# Patient Record
Sex: Female | Born: 1985 | Race: Asian | Hispanic: No | Marital: Married | Smoking: Never smoker
Health system: Southern US, Community
[De-identification: ages and names within clinical notes are randomized; demographics above are authoritative.]

## PROBLEM LIST (undated history)

## (undated) ENCOUNTER — Inpatient Hospital Stay (HOSPITAL_COMMUNITY): Payer: Self-pay

## (undated) DIAGNOSIS — D649 Anemia, unspecified: Secondary | ICD-10-CM

## (undated) HISTORY — PX: NO PAST SURGERIES: SHX2092

---

## 2006-11-04 ENCOUNTER — Inpatient Hospital Stay (HOSPITAL_COMMUNITY): Admission: EM | Admit: 2006-11-04 | Discharge: 2006-11-06 | Payer: Self-pay | Admitting: Emergency Medicine

## 2008-09-30 ENCOUNTER — Other Ambulatory Visit: Admission: RE | Admit: 2008-09-30 | Discharge: 2008-09-30 | Payer: Self-pay | Admitting: Family Medicine

## 2008-11-08 ENCOUNTER — Emergency Department: Payer: Self-pay | Admitting: Internal Medicine

## 2008-11-08 ENCOUNTER — Inpatient Hospital Stay (HOSPITAL_COMMUNITY): Admission: AD | Admit: 2008-11-08 | Discharge: 2008-11-08 | Payer: Self-pay | Admitting: Obstetrics and Gynecology

## 2008-11-10 ENCOUNTER — Inpatient Hospital Stay (HOSPITAL_COMMUNITY): Admission: AD | Admit: 2008-11-10 | Discharge: 2008-11-10 | Payer: Self-pay | Admitting: Obstetrics and Gynecology

## 2009-08-11 ENCOUNTER — Inpatient Hospital Stay (HOSPITAL_COMMUNITY): Admission: AD | Admit: 2009-08-11 | Discharge: 2009-08-13 | Payer: Self-pay | Admitting: Obstetrics and Gynecology

## 2011-03-30 LAB — CROSSMATCH
ABO/RH(D): AB POS
Antibody Screen: NEGATIVE

## 2011-03-30 LAB — CBC
HCT: 24.4 % — ABNORMAL LOW (ref 36.0–46.0)
Hemoglobin: 7.6 g/dL — CL (ref 12.0–15.0)
Platelets: 280 10*3/uL (ref 150–400)
RBC: 3.85 MIL/uL — ABNORMAL LOW (ref 3.87–5.11)
RDW: 19.7 % — ABNORMAL HIGH (ref 11.5–15.5)
RDW: 19.8 % — ABNORMAL HIGH (ref 11.5–15.5)
WBC: 17.1 10*3/uL — ABNORMAL HIGH (ref 4.0–10.5)

## 2011-03-30 LAB — HEMOGLOBIN: Hemoglobin: 7.6 g/dL — CL (ref 12.0–15.0)

## 2011-03-30 LAB — HEMOGLOBIN AND HEMATOCRIT, BLOOD
HCT: 21.2 % — ABNORMAL LOW (ref 36.0–46.0)
Hemoglobin: 6.5 g/dL — CL (ref 12.0–15.0)

## 2011-03-30 LAB — ABO/RH: ABO/RH(D): AB POS

## 2011-05-10 NOTE — H&P (Signed)
NAMEJERRIE, SCHUSSLER NO.:  [id number]   MEDICAL RECORD NO.:  [id number]          PATIENT TYPE:  INP   LOCATION:  0103                         FACILITY:  [facility]   PHYSICIAN:  Jackie Plum, M.D.DATE OF BIRTH:  1986/07/17   DATE OF ADMISSION:  11/03/2006  DATE OF DISCHARGE:                                HISTORY & PHYSICAL   CHIEF COMPLAINT:  Tylenol overdose.   HISTORY OF PRESENT ILLNESS:  The patient is a 25 year old college student  who was brought to the ED by the mother after taking at least 20 tablets of  Tylenol.  This was done in an attempt to commit suicide.  The patient could  not state the reason behind her actions, but states that she has not been  happy at home and has been feeling very sad.  In the emergency room the  patient's Tylenol level was elevated.  She was started on a loading dose of  Mucomyst IV per protocol and she is admitted for further evaluation and  management in this regard.   PAST MEDICAL HISTORY:  Negative for any specific chronic illness.  She has  never had any surgeries.   MEDICATIONS:  The patient does not have any medication allergies and she is  not on any regular medications.   She is a Archivist at [employer], studying healthcare related subjects. She  does not smoke cigarettes nor drink alcohol.   REVIEW OF SYSTEMS:  Negative except for complaints of nausea and anorexia  with upper abdominal pain.   PHYSICAL EXAMINATION:  VITAL SIGNS:  BP 97/55, pulse 81, temperature 97.3  degrees Fahrenheit, respirations 18, and O2 saturation of 100% on room air.  GENERAL EXAMINATION:  She was not in acute cardiopulmonary distress.  HEENT:  Pupils equal, round, and react to light. Extraocular movements  intact.  Oropharynx moist.  There is no scleral pallor or sclerae icterus.  NECK:  Supple, no JVD.  LUNGS:  Clear to auscultation.  CARDIAC:  Rhythm was regular.  No gross murmur.  ABDOMEN:  The patient had soft generalized  abdomen; however, she had some  tenderness or guarding in the upper right quadrant as well as epigastric  area.  There was no rebound tenderness.  I could not appreciate any  hepatosplenomegaly.  EXTREMITIES:  No cyanosis, no edema.  CNS:  Nonfocal.   LAB WORK:  WBC 7.6, hemoglobin 10.6, hematocrit 32.5, MCV 69.2, platelet  count 372.  Drug of abuse screen was negative.  Sodium 135, potassium 3.1,  chloride 99, CO2 21, glucose 84, BUN 14, creatinine 0.8, potassium 9.0.  Salicylate level less than 4.  Alcohol level 6.  UA was negative for a  urinary tract infection.  Her PT was 14.9.  INR 1.1.  Her acetaminophen  level 298.2 mcg/mL (upper limits of normal 30).  Protein 7.6, albumin 3.4,  AST 29, ALT 13, alkaline phosphatase 51, total bilirubin 0.5.   IMPRESSION:  Tylenol overdose.  The patient is admitted to the hospital for  administration of Mucomyst IV per protocol with IV fluids and supportive  measures including antinauseants and  antiemetics.  We will monitor her  __________ panel  as well as transaminase  with Tylenol level.  The patient  will need to be seen by psychiatric consult in the morning. Further  recommendations will be made as __________ response.      Jackie Plum, M.D.  Electronically Signed     GO/MEDQ  D:  11/04/2006  T:  11/04/2006  Job:  [id number]

## 2011-05-10 NOTE — Consult Note (Signed)
[name], Carr NO.:  [id number]   MEDICAL RECORD NO.:  [id number]          PATIENT TYPE:  INP   LOCATION:  6738                         FACILITY:  [facility]   PHYSICIAN:  Danielle Carr, M.D.  DATE OF BIRTH:  1986/04/18   DATE OF CONSULTATION:  11/05/2006  DATE OF DISCHARGE:                                   CONSULTATION   REQUESTING PHYSICIAN:  Danielle L. Lendell Caprice, MD   REASON FOR CONSULTATION:  Overdose.   HISTORY OF PRESENT ILLNESS:  Ms. Danielle Carr is a 25 year old single  female admitted to the [facility] on 03 November 2006 after an  overdose of Tylenol.  The patient states that she had a lot of time to think  on November 12 and was ruminating over some academic grades that were  disappointing.  She was ruminating over how she had certain standards of GPA  for her occupational goals.  She became progressively depressed and  impulsively took 20 tablets of Tylenol in a suicide attempt.   The patient describes a normal mood with normal level of interests.  She has  not had any sleep difficulty.  Her concentration has been within normal  limits, and her appetite has been normal.  She has not been having any other  days of suicidal thoughts or hallucinations or delusions.  She has not been  having any thoughts of harming others.  Her memory and orientation have been  within normal limits. She has not been doing any illegal drugs or alcohol.   The patient denies any other stressors at this time in her life.  On day of  examination, she has not been having any suicidal thoughts since November  12.  She has been socially appropriate and cooperative with care on the  medical ward.  She is sleeping well other than health care interruptions.  Her appetite is within normal limits.  She reports constructive future  goals, and her energy is normal.  She has a constructive level of regret  about the overdose and a desire to improve her coping skills and  stress  management.   PAST PSYCHIATRIC HISTORY:  The patient has no prior history of self harm of  any nature or suicide attempts.  She has never had any psychiatric care or  counseling.  She has never been on psychotropic medications.  She has never  had hallucinations, delusions, or mania.  She has no eating disorder history  or phobias.  She does tend to worry; however, this has not produced  additional symptoms of chronic insomnia or muscle tension.  There are no  known panic attacks.  However, the excess worry has been prone towards  catastrophization regarding her future academic possibilities and job  possibilities.   FAMILY PSYCHIATRIC HISTORY:  None known.   SOCIAL HISTORY:  The patient is single.  She has no children.  She lives  with her mother.  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  She has a  supportive boyfriend.  She  has continued to receive supportive phone calls  and visits during her hospitalization and has responded well to the support.  She does not use alcohol or illegal drugs.   MEDICATIONS:  MAR is reviewed.  The patient is finishing up the  acetylcysteine protocol.   ALLERGIES:  No known drug allergies.   LABORATORY DATA:  White blood cell count 7.6, hemoglobin 10.6, platelets  372.  INR 1.3.  Metabolic panel shows BUN 2, creatinine 0.5.  She has  elevated transaminases on November 13 with SGOT at 83 and SGPT at 43.  Today  the SGOT is 28, SGPT 30.  The albumin is 2.9.  The urine HCG was negative.  Initial Tylenol level was 298.2.  Aspirin level was negative.  Urine drug  screen was negative.   REVIEW OF SYSTEMS:  CONSTITUTIONAL:  Afebrile.  HEAD: No trauma. EYES: No  visual changes.  The patient does have astigmatism and some nearsightedness.  She has no hearing difficulty. NOSE: No rhinorrhea.  MOUTH, THROAT:  No sore  throat.  NEUROLOGIC: No history of seizures.  PSYCHIATRIC:  As above.  CARDIOVASCULAR: No chest pain, palpitations, or edema. RESPIRATORY: No  coughing or wheezing.  GASTROINTESTINAL:  She did have some initial nausea  which has cleared today.  She has no vomiting, diarrhea. GENITOURINARY: No  dysuria. SKIN: Unremarkable, no rashes or erythema.  MUSCULOSKELETAL: No  deformities.  ENDOCRINE/METABOLIC:  There was initially some hyperglycemia  which is no longer present.  HEMATOLOGIC/LYMPHATIC:  Mild anemia.   PHYSICAL EXAMINATION:  VITAL SIGNS: Temperature 98.3, pulse 98, respirations  20, blood pressure 107/61, O2 saturation on room air 98%.   MENTAL STATUS EXAM:  Danielle Carr is an alert female with good eye contact,  appearing her stated age, sitting up in a hospital bed.  Her psychomotor  tone is within normal limits. She is oriented to all spheres.  Her memory is  intact to immediate, recent, remote.  Thought process is logical, coherent,  goal directed.  No looseness of association.  Thought content: No thoughts  of harming herself, no thoughts of harming others, no delusions, no  hallucinations.  Her fund of knowledge and intelligence are above average.  Her speech involves normal rate and prosody.  Her mood is slightly anxious.  Affect is mildly anxious at baseline, but then calm as the interview  proceeds.  Her judgment is within normal limits.  Insight is partial.  Concentration is within normal limits.   ASSESSMENT:  AXIS I:  1. 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  2. Adjustment disorder with mixed disturbance of emotions and conduct.  AXIS II:  Deferred.  AXIS III:  See general medical problems.  AXIS IV:  Academic.  AXIS V:  55.   The undersigned did recommend inpatient psychiatric hospitalization to allow  for the most intensive further evaluation and treatment given that the patient did make an overdose; however, the patient declined and is no  longer  committable after receiving and benefiting from support.   She is highly motivated for outpatient psychiatric treatment.  She has an  appropriate level of regret about the overdose and a desire to improve her  coping abilities.   At the request of the patient and permission, the undersigned spoke with the  patient's mother about the state of the patient and the treatment plan.   The undersigned provided ego supportive psychotherapy and education.  The  undersigned also  introduced the patient to the principles of cognitive  behavioral therapy.   Concerning biologic therapy, the patient does not present a symptom pattern  but requires medication management.  Her pattern of periodic excess worry  can respond well to cognitive behavioral therapy. In the future if she  persists with any significant excess worry or anxiety symptoms that to not  respond to cognitive behavioral therapy, an SSRI could be considered.   The patient agrees to call emergency services immediately for any thoughts  of harming herself, thoughts of harming others, or severe distress.   In addition to introducing the patient to the principles of cognitive  behavioral therapy, the intensive outpatient program could provide coping  skills and stress management tools that would greatly benefit the patient,  and she is motivated for this.   RECOMMENDATIONS:  1. Would discontinue the sitter.  2. Would ask the case manager to set this patient up with the intensive      outpatient program at one of the local hospitals: [facility],      [facility], or [facility].  Following the therapy, the patient could      be set up with individual cognitive behavioral therapy.  3. No psychotropics indicated at this time.      Danielle Carr, M.D.  Electronically Signed     JW/MEDQ  D:  11/05/2006  T:  11/05/2006  Job:  [id number]

## 2011-05-10 NOTE — Discharge Summary (Signed)
[name], Danielle Carr NO.:  [id number]   MEDICAL RECORD NO.:  [id number]          PATIENT TYPE:  INP   LOCATION:  6738                         FACILITY:  [facility]   PHYSICIAN:  Theone Stanley, MD   DATE OF BIRTH:  Jan 30, 1986   DATE OF ADMISSION:  11/05/2006  DATE OF DISCHARGE:  11/06/2006                               DISCHARGE SUMMARY   ADMISSION DIAGNOSIS:  Acetaminophen overdose.   DISCHARGE DIAGNOSIS:  Acetaminophen overdose.   CONSULTATIONS:  Dr. Antonietta Breach with psychiatry.   HOSPITAL COURSE:  [name] is a very pleasant 25 year old female who  apparently has not had major stresses and is not exactly sure she why  decided to take a large amount of Tylenol.  It appears to be a total of  20 pills.  She was brought to the ER, started on Mucomyst.  Her original  acetaminophen level was greater than 100, subsequently came down to less  than 10.  Her AST and ALT were also elevated, these came down to normal.  She had Mucomyst per pharmacy protocol during this time, poison control  was also contacted and was involved in her case.  The patient continued  to improve.  All of her lab values came down to normal.  After seeing  Dr. Jeanie Sewer, he had cleared her and it was felt that she could go with  intensive outpatient therapy.  The patient was discharged in stable  condition on November 06, 2006.  She is to follow up with outpatient  psychiatry and follow up with her primary care physician in 1 to 2  weeks.   no discharge medication   discharge stable home      Theone Stanley, MD  Electronically Signed     AEJ/MEDQ  D:  11/07/2006  T:  11/07/2006  Job:  [id number]

## 2011-09-25 LAB — CBC
Platelets: 415 — ABNORMAL HIGH
RBC: 4.64
WBC: 8.8

## 2011-09-25 LAB — HCG, QUANTITATIVE, PREGNANCY
hCG, Beta Chain, Quant, S: 1796 — ABNORMAL HIGH
hCG, Beta Chain, Quant, S: 438 — ABNORMAL HIGH

## 2014-08-15 ENCOUNTER — Encounter (HOSPITAL_COMMUNITY): Payer: Self-pay | Admitting: Obstetrics and Gynecology

## 2014-08-15 ENCOUNTER — Inpatient Hospital Stay (HOSPITAL_COMMUNITY)
Admission: AC | Admit: 2014-08-15 | Discharge: 2014-08-15 | Disposition: A | Discharge status: Home / Self Care | Payer: Self-pay | Source: Ambulatory Visit | Attending: Obstetrics and Gynecology | Admitting: Obstetrics and Gynecology

## 2014-08-15 ENCOUNTER — Inpatient Hospital Stay (HOSPITAL_COMMUNITY): Payer: Self-pay

## 2014-08-15 DIAGNOSIS — O209 Hemorrhage in early pregnancy, unspecified: Secondary | ICD-10-CM | POA: Insufficient documentation

## 2014-08-15 DIAGNOSIS — Z87891 Personal history of nicotine dependence: Secondary | ICD-10-CM | POA: Insufficient documentation

## 2014-08-15 LAB — CBC
HCT: 31.4 % — ABNORMAL LOW (ref 36.0–46.0)
Hemoglobin: 9.3 g/dL — ABNORMAL LOW (ref 12.0–15.0)
MCH: 18.9 pg — ABNORMAL LOW (ref 26.0–34.0)
MCHC: 29.6 g/dL — ABNORMAL LOW (ref 30.0–36.0)
MCV: 64 fL — AB (ref 78.0–100.0)
PLATELETS: 367 10*3/uL (ref 150–400)
RBC: 4.91 MIL/uL (ref 3.87–5.11)
RDW: 18 % — AB (ref 11.5–15.5)
WBC: 6.8 10*3/uL (ref 4.0–10.5)

## 2014-08-15 LAB — WET PREP, GENITAL
CLUE CELLS WET PREP: NONE SEEN
Trich, Wet Prep: NONE SEEN
Yeast Wet Prep HPF POC: NONE SEEN

## 2014-08-15 LAB — POCT PREGNANCY, URINE: Preg Test, Ur: POSITIVE — AB

## 2014-08-15 LAB — HCG, QUANTITATIVE, PREGNANCY: HCG, BETA CHAIN, QUANT, S: 395 m[IU]/mL — AB (ref <5)

## 2014-08-15 NOTE — MAU Note (Signed)
Pt reports blood on the tissue when she wipes since 3 pm. Lower back pain. LMP 06/24/2014

## 2014-08-15 NOTE — MAU Provider Note (Signed)
Chief Complaint: Possible Pregnancy, Vaginal Bleeding and Back Pain   SUBJECTIVE HPI: Danielle Carr is a 28 y.o. G2P1001 [redacted]w[redacted]d at by unsure LMP who presents with onset first episode of vaginal bleeding today. Passed 2 small clots at 1500, and since then has only seen pink spotting with wiping, not enough to wear a pad. Last intercourse 3 days ago. Mild low back pain today. Denies abdominal pain or irritative vaginal discharge. Breasts sore. No nausea.  HPT pos x3.  AB pos  No past medical history on file. OB History  No data available   No past surgical history on file. History   Social History  . Marital Status: Single    Spouse Name: N/A    Number of Children: N/A  . Years of Education: N/A   Occupational History  . Not on file.   Social History Main Topics  . Smoking status: Not on file  . Smokeless tobacco: Not on file  . Alcohol Use: Not on file  . Drug Use: Not on file  . Sexual Activity: Not on file   Other Topics Concern  . Not on file   Social History Narrative  . No narrative on file   No current facility-administered medications on file prior to encounter.   No current outpatient prescriptions on file prior to encounter.   Allergies not on file  ROS: Pertinent items in HPI  OBJECTIVE Blood pressure 114/71, pulse 114, temperature 99 F (37.2 C), temperature source Oral, resp. rate 18, height  (1.499 m), weight 44.566 kg (98 lb 4 oz), last menstrual period 06/24/2014, SpO2 100.00%. GENERAL: Well-developed, well-nourished female in no acute distress.  HEENT: Normocephalic HEART: normal rate RESP: normal effort ABDOMEN: Soft, non-tender EXTREMITIES: Nontender, no edema NEURO: Alert and oriented SPECULUM EXAM: NEFG, physiologic discharge, scant pink blood noted, cervix clean BIMANUAL: cervix closed; uterus 6 wks size, no adnexal tenderness or masses  LAB RESULTS Results for orders placed during the hospital encounter of 08/15/14 (from the past 24  hour(s))  POCT PREGNANCY, URINE     Status: Abnormal   Collection Time    08/15/14  7:50 PM      Result Value Ref Range   Preg Test, Ur POSITIVE (*) NEGATIVE  HCG, QUANTITATIVE, PREGNANCY     Status: Abnormal   Collection Time    08/15/14  7:52 PM      Result Value Ref Range   hCG, Beta Chain, Quant, S 395 (*) <5 mIU/mL  CBC     Status: Abnormal   Collection Time    08/15/14  7:52 PM      Result Value Ref Range   WBC 6.8  4.0 - 10.5 K/uL   RBC 4.91  3.87 - 5.11 MIL/uL   Hemoglobin 9.3 (*) 12.0 - 15.0 g/dL   HCT 40.9 (*) 81.1 - 91.4 %   MCV 64.0 (*) 78.0 - 100.0 fL   MCH 18.9 (*) 26.0 - 34.0 pg   MCHC 29.6 (*) 30.0 - 36.0 g/dL   RDW 78.2 (*) 95.6 - 21.3 %   Platelets 367  150 - 400 K/uL  WET PREP, GENITAL     Status: Abnormal   Collection Time    08/15/14  8:00 PM      Result Value Ref Range   Yeast Wet Prep HPF POC NONE SEEN  NONE SEEN   Trich, Wet Prep NONE SEEN  NONE SEEN   Clue Cells Wet Prep HPF POC NONE SEEN  NONE SEEN  WBC, Wet Prep HPF POC FEW (*) NONE SEEN    IMAGING Prelim report: No GS, YS, or embryo; adnexae normal, no FF   MAU COURSE  GC/CT sent  ASSESSMENT 1. Bleeding in early pregnancy     PLAN Discharge home with ectopic precautions    Medication List         BIOTIN PO  Take 1 tablet by mouth daily.     [employer] PRENATAL GUMMY PO  Take 2 tablets by mouth daily.     PREDNISOLONE ACETATE OP  Apply 3 drops to eye daily.       Follow-up Information   Follow up with Nursepractioner Mau, NP In 2 days. (Repeat blood test)        [name], CNM 08/15/2014  7:50 PM

## 2014-08-15 NOTE — Discharge Instructions (Signed)
Vaginal Bleeding During Pregnancy, First Trimester  A small amount of bleeding (spotting) from the vagina is relatively common in early pregnancy. It usually stops on its own. Various things may cause bleeding or spotting in early pregnancy. Some bleeding may be related to the pregnancy, and some may not. In most cases, the bleeding is normal and is not a problem. However, bleeding can also be a sign of something serious. Be sure to tell your health care provider about any vaginal bleeding right away.  Some possible causes of vaginal bleeding during the first trimester include:  · Infection or inflammation of the cervix.  · Growths (polyps) on the cervix.  · Miscarriage or threatened miscarriage.  · Pregnancy tissue has developed outside of the uterus and in a fallopian tube (tubal pregnancy).  · Tiny cysts have developed in the uterus instead of pregnancy tissue (molar pregnancy).  HOME CARE INSTRUCTIONS   Watch your condition for any changes. The following actions may help to lessen any discomfort you are feeling:  · Follow your health care provider's instructions for limiting your activity. If your health care provider orders bed rest, you may need to stay in bed and only get up to use the bathroom. However, your health care provider may allow you to continue light activity.  · If needed, make plans for someone to help with your regular activities and responsibilities while you are on bed rest.  · Keep track of the number of pads you use each day, how often you change pads, and how soaked (saturated) they are. Write this down.  · Do not use tampons. Do not douche.  · Do not have sexual intercourse or orgasms until approved by your health care provider.  · If you pass any tissue from your vagina, save the tissue so you can show it to your health care provider.  · Only take over-the-counter or prescription medicines as directed by your health care provider.  · Do not take aspirin because it can make you  bleed.  · Keep all follow-up appointments as directed by your health care provider.  SEEK MEDICAL CARE IF:  · You have any vaginal bleeding during any part of your pregnancy.  · You have cramps or labor pains.  · You have a fever, not controlled by medicine.  SEEK IMMEDIATE MEDICAL CARE IF:   · You have severe cramps in your back or belly (abdomen).  · You pass large clots or tissue from your vagina.  · Your bleeding increases.  · You feel light-headed or weak, or you have fainting episodes.  · You have chills.  · You are leaking fluid or have a gush of fluid from your vagina.  · You pass out while having a bowel movement.  MAKE SURE YOU:  · Understand these instructions.  · Will watch your condition.  · Will get help right away if you are not doing well or get worse.  Document Released: 09/18/2005 Document Revised: 12/14/2013 Document Reviewed: 08/16/2013  ExitCare® Patient Information ©2015 [employer]. This information is not intended to replace advice given to you by your health care provider. Make sure you discuss any questions you have with your health care provider.

## 2014-08-16 LAB — GC/CHLAMYDIA PROBE AMP
CT PROBE, AMP APTIMA: NEGATIVE
GC PROBE AMP APTIMA: NEGATIVE

## 2014-08-16 NOTE — MAU Provider Note (Signed)
Attestation of Attending Supervision of Advanced Practitioner (CNM/NP): Evaluation and management procedures were performed by the Advanced Practitioner under my supervision and collaboration.  I have reviewed the Advanced Practitioner's note and chart, and I agree with the management and plan.  Samuele Storey 08/16/2014 1:25 AM   

## 2014-08-17 ENCOUNTER — Inpatient Hospital Stay (HOSPITAL_COMMUNITY)
Admission: AD | Admit: 2014-08-17 | Discharge: 2014-08-17 | Disposition: A | Discharge status: Home / Self Care | Payer: Self-pay | Source: Ambulatory Visit | Attending: Obstetrics and Gynecology | Admitting: Obstetrics and Gynecology

## 2014-08-17 ENCOUNTER — Encounter (HOSPITAL_COMMUNITY): Payer: Self-pay | Admitting: *Deleted

## 2014-08-17 DIAGNOSIS — O039 Complete or unspecified spontaneous abortion without complication: Secondary | ICD-10-CM | POA: Insufficient documentation

## 2014-08-17 LAB — HCG, QUANTITATIVE, PREGNANCY: hCG, Beta Chain, Quant, S: 268 m[IU]/mL — ABNORMAL HIGH (ref <5)

## 2014-08-17 NOTE — Discharge Instructions (Signed)
Incomplete Miscarriage A miscarriage is the sudden loss of an unborn baby (fetus) before the 20th week of pregnancy. In an incomplete miscarriage, parts of the fetus or placenta (afterbirth) remain in the body.  Having a miscarriage can be an emotional experience. Talk with your health care provider about any questions you may have about miscarrying, the grieving process, and your future pregnancy plans. CAUSES   Problems with the fetal chromosomes that make it impossible for the baby to develop normally. Problems with the baby's genes or chromosomes are most often the result of errors that occur by chance as the embryo divides and grows. The problems are not inherited from the parents.  Infection of the cervix or uterus.  Hormone problems.  Problems with the cervix, such as having an incompetent cervix. This is when the tissue in the cervix is not strong enough to hold the pregnancy.  Problems with the uterus, such as an abnormally shaped uterus, uterine fibroids, or congenital abnormalities.  Certain medical conditions.  Smoking, drinking alcohol, or taking illegal drugs.  Trauma. SYMPTOMS   Vaginal bleeding or spotting, with or without cramps or pain.  Pain or cramping in the abdomen or lower back.  Passing fluid, tissue, or blood clots from the vagina. DIAGNOSIS  Your health care provider will perform a physical exam. You may also have an ultrasound to confirm the miscarriage. Blood or urine tests may also be ordered. TREATMENT   Usually, a dilation and curettage (D&C) procedure is performed. During a D&C procedure, the cervix is widened (dilated) and any remaining fetal or placental tissue is gently removed from the uterus.  Antibiotic medicines are prescribed if there is an infection. Other medicines may be given to reduce the size of the uterus (contract) if there is a lot of bleeding.  If you have Rh negative blood and your baby was Rh positive, you will need a Rho (D)  immune globulin shot. This shot will protect any future baby from having Rh blood problems in future pregnancies.  You may be confined to bed rest. This means you should stay in bed and only get up to use the bathroom. HOME CARE INSTRUCTIONS   Rest as directed by your health care provider.  Restrict activity as directed by your health care provider. You may be allowed to continue light activity if curettage was not done but you require further treatment.  Keep track of the number of pads you use each day. Keep track of how soaked (saturated) they are. Record this information.  Do not  use tampons.  Do not douche or have sexual intercourse until approved by your health care provider.  Keep all follow-up appointments for reevaluation and continuing management.  Only take over-the-counter or prescription medicines for pain, fever, or discomfort as directed by your health care provider.  Take antibiotic medicine as directed by your health care provider. Make sure you finish it even if you start to feel better. SEEK IMMEDIATE MEDICAL CARE IF:   You experience severe cramps in your stomach, back, or abdomen.  You have an unexplained temperature (make sure to record these temperatures).  You pass large clots or tissue (save these for your health care provider to inspect).  Your bleeding increases.  You become light-headed, weak, or have fainting episodes. MAKE SURE YOU:   Understand these instructions.  Will watch your condition.  Will get help right away if you are not doing well or get worse. Document Released: 12/09/2005 Document Revised: 04/25/2014 Document Reviewed:   07/08/2013 ExitCare Patient Information 2015 [employer]. This information is not intended to replace advice given to you by your health care provider. Make sure you discuss any questions you have with your health care provider.  

## 2014-08-17 NOTE — MAU Provider Note (Signed)
Ms. Danielle Carr is a 28 y.o. G3P1010 at Unknown who presents to MAU today for 48 hour follow-up quant hCG. Patient states that she has continued to have some light bleeding and mild lower abdominal and low back pain. She denies fever.   BP 111/70  Pulse 93  Temp(Src) 99.4 F (37.4 C) (Oral)  Resp 16  Ht  (1.499 m)  Wt 98 lb (44.453 kg)  BMI 19.78 kg/m2  SpO2 100%  LMP 06/24/2014 GENERAL: Well-developed, well-nourished female in no acute distress.  HEENT: Normocephalic, atraumatic.   LUNGS: Effort normal HEART: Regular rate  SKIN: Warm, dry and without erythema PSYCH: Normal mood and affect  Results for Danielle, Carr (MRN [id number]) as of 08/17/2014 20:58  Ref. Range 08/15/2014 19:52 08/15/2014 20:00 08/15/2014 20:59 08/17/2014 20:04  hCG, Beta Chain, Quant, S Latest Range: <5 mIU/mL 395 (H)   268 (H)   A: SAB  P: Discharge home Bleeding precautions discussed Patient referred to [facility] for follow-up in ~ 2 weeks Patient may return to MAU as needed or if her condition were to change or worsen  Freddi Starr, PA-C  08/17/2014 8:59 PM

## 2014-08-19 NOTE — MAU Provider Note (Signed)
Attestation of Attending Supervision of Advanced Practitioner: Evaluation and management procedures were performed by the PA/NP/CNM/OB Fellow under my supervision/collaboration. Chart reviewed and agree with management and plan.  Karan Ramnauth V 08/19/2014 2:58 PM

## 2014-09-09 ENCOUNTER — Ambulatory Visit (INDEPENDENT_AMBULATORY_CARE_PROVIDER_SITE_OTHER): Payer: Self-pay | Admitting: Obstetrics & Gynecology

## 2014-09-09 ENCOUNTER — Encounter: Payer: Self-pay | Admitting: Obstetrics & Gynecology

## 2014-09-09 VITALS — BP 117/83 | HR 113 | Temp 97.6°F | Wt 96.8 lb

## 2014-09-09 DIAGNOSIS — O039 Complete or unspecified spontaneous abortion without complication: Secondary | ICD-10-CM | POA: Insufficient documentation

## 2014-09-09 NOTE — Progress Notes (Signed)
Patient ID: Danielle Carr [place], female   DOB: Oct 01, 1986, 28 y.o.   MRN: [id number]  Chief Complaint  Patient presents with  . Follow-up    HPI Danielle Carr is a 28 y.o. female.  Seen in MAU 8/26 and diagnosed with 7 week complete SAb, no bleeding now. Wants to conceive  HPI  History reviewed. No pertinent past medical history.  Past Surgical History  Procedure Laterality Date  . No past surgeries      History reviewed. No pertinent family history.  Social History History  Substance Use Topics  . Smoking status: Never Smoker   . Smokeless tobacco: Not on file  . Alcohol Use: No    Allergies  Allergen Reactions  . Lavender Oil Hives  . Sulfa Antibiotics Hives and Swelling    Current Outpatient Prescriptions  Medication Sig Dispense Refill  . Prenatal Vit-Min-FA-Fish Oil ([facility] PRENATAL GUMMY PO) Take 2 tablets by mouth daily.      Marland Kitchen BIOTIN PO Take 1 tablet by mouth daily.      Marland Kitchen PREDNISOLONE ACETATE OP Apply 3 drops to eye daily.       No current facility-administered medications for this visit.    Review of Systems Review of Systems  Constitutional: Negative.   Genitourinary: Negative for vaginal bleeding, vaginal discharge and menstrual problem.    Blood pressure 117/83, pulse 113, temperature 97.6 F (36.4 C), weight 96 lb 12.8 oz (43.908 kg), last menstrual period 06/24/2014, unknown if currently breastfeeding.  Physical Exam Physical Exam  Constitutional: She is oriented to person, place, and time. She appears well-developed. No distress.  Pulmonary/Chest: Effort normal. No respiratory distress.  Neurological: She is alert and oriented to person, place, and time.  Psychiatric: She has a normal mood and affect. Her behavior is normal.    Data Reviewed MAU notes and labs   Assessment    S/P SAb     Plan    Requested HCG will check result       Kalecia Hartney 09/09/2014, 9:20 AM

## 2014-09-09 NOTE — Progress Notes (Signed)
Pt denies any bleeding or abdominal pain.  Pt does have pain in lower back but states this is normal for her.

## 2014-09-09 NOTE — Patient Instructions (Signed)
Miscarriage A miscarriage is the sudden loss of an unborn baby (fetus) before the 20th week of pregnancy. Most miscarriages happen in the first 3 months of pregnancy. Sometimes, it happens before a woman even knows she is pregnant. A miscarriage is also called a "spontaneous miscarriage" or "early pregnancy loss." Having a miscarriage can be an emotional experience. Talk with your caregiver about any questions you may have about miscarrying, the grieving process, and your future pregnancy plans. CAUSES   Problems with the fetal chromosomes that make it impossible for the baby to develop normally. Problems with the baby's genes or chromosomes are most often the result of errors that occur, by chance, as the embryo divides and grows. The problems are not inherited from the parents.  Infection of the cervix or uterus.   Hormone problems.   Problems with the cervix, such as having an incompetent cervix. This is when the tissue in the cervix is not strong enough to hold the pregnancy.   Problems with the uterus, such as an abnormally shaped uterus, uterine fibroids, or congenital abnormalities.   Certain medical conditions.   Smoking, drinking alcohol, or taking illegal drugs.   Trauma.  Often, the cause of a miscarriage is unknown.  SYMPTOMS   Vaginal bleeding or spotting, with or without cramps or pain.  Pain or cramping in the abdomen or lower back.  Passing fluid, tissue, or blood clots from the vagina. DIAGNOSIS  Your caregiver will perform a physical exam. You may also have an ultrasound to confirm the miscarriage. Blood or urine tests may also be ordered. TREATMENT   Sometimes, treatment is not necessary if you naturally pass all the fetal tissue that was in the uterus. If some of the fetus or placenta remains in the body (incomplete miscarriage), tissue left behind may become infected and must be removed. Usually, a dilation and curettage (D and C) procedure is performed.  During a D and C procedure, the cervix is widened (dilated) and any remaining fetal or placental tissue is gently removed from the uterus.  Antibiotic medicines are prescribed if there is an infection. Other medicines may be given to reduce the size of the uterus (contract) if there is a lot of bleeding.  If you have Rh negative blood and your baby was Rh positive, you will need a Rh immunoglobulin shot. This shot will protect any future baby from having Rh blood problems in future pregnancies. HOME CARE INSTRUCTIONS   Your caregiver may order bed rest or may allow you to continue light activity. Resume activity as directed by your caregiver.  Have someone help with home and family responsibilities during this time.   Keep track of the number of sanitary pads you use each day and how soaked (saturated) they are. Write down this information.   Do not use tampons. Do not douche or have sexual intercourse until approved by your caregiver.   Only take over-the-counter or prescription medicines for pain or discomfort as directed by your caregiver.   Do not take aspirin. Aspirin can cause bleeding.   Keep all follow-up appointments with your caregiver.   If you or your partner have problems with grieving, talk to your caregiver or seek counseling to help cope with the pregnancy loss. Allow enough time to grieve before trying to get pregnant again.  SEEK IMMEDIATE MEDICAL CARE IF:   You have severe cramps or pain in your back or abdomen.  You have a fever.  You pass large blood clots (walnut-sized   or larger) ortissue from your vagina. Save any tissue for your caregiver to inspect.   Your bleeding increases.   You have a thick, bad-smelling vaginal discharge.  You become lightheaded, weak, or you faint.   You have chills.  MAKE SURE YOU:  Understand these instructions.  Will watch your condition.  Will get help right away if you are not doing well or get  worse. Document Released: 06/04/2001 Document Revised: 04/05/2013 Document Reviewed: 01/28/2012 Exit[employer] Patient Information 2015 [employer]. This information is not intended to replace advice given to you by your health care provider. Make sure you discuss any questions you have with your health care provider.  

## 2014-09-10 LAB — HCG, QUANTITATIVE, PREGNANCY: hCG, Beta Chain, Quant, S: <2 m[IU]/mL

## 2014-09-12 ENCOUNTER — Telehealth: Payer: Self-pay | Admitting: General Practice

## 2014-09-12 NOTE — Telephone Encounter (Signed)
Called patient and informed her of results. Patient verbalized understanding and states she already saw this on mychart. Patient had no questions

## 2014-09-12 NOTE — Telephone Encounter (Signed)
Message copied by Kathee Delton on Mon Sep 12, 2014 10:34 AM ------      Message from: Adam Phenix      Created: Sat Sep 10, 2014  8:59 AM       Negative pregnancy test ------

## 2014-10-24 ENCOUNTER — Encounter: Payer: Self-pay | Admitting: Obstetrics & Gynecology

## 2014-12-23 NOTE — L&D Delivery Note (Signed)
Delivery Note 29yo G4P1021@[redacted]w[redacted]d  who presented due to vaginal spotting and was noted to be 8cm dilated.  Pregnancy complicated by: 1)GBS bacteruria 2) Anemia- on iron daily.  Due to slow progression and contractions q , pt was started on Pitocin.  Once the patient completed 4hr of Amp, AROM was performed- clear fluid.  She progressed to complete, pundendal block was performed using 20 cc of Lidocaine.  At 3:10 PM a viable and healthy female was delivered via Vaginal, Spontaneous Delivery (Presentation: Right Occiput Anterior).  APGAR: 9, 9; weight 6 lb 5.4 oz (2875 g).  Placenta status: Intact, Spontaneous.  Of note- a small piece of membrane was noted to be retained and the cause of increased vaginal bleeding.  Once membrane removed, bleeding resolved and uterus firm.  Cord: 3 vessels with the following complications: None.   Anesthesia: Pudendal  Episiotomy: None Lacerations: 2nd degree Suture Repair: 2.0 3.0 vicryl Est. Blood Loss (mL): 456  Mom to postpartum.  Baby to Couplet care / Skin to Skin.  Myna Hidalgo, M 09/28/2015, 6:43 PM

## 2014-12-27 ENCOUNTER — Ambulatory Visit (INDEPENDENT_AMBULATORY_CARE_PROVIDER_SITE_OTHER): Payer: 59 | Admitting: Family Medicine

## 2014-12-27 VITALS — BP 120/84 | HR 120 | Temp 98.1°F | Resp 18 | Ht 60.0 in | Wt 99.2 lb

## 2014-12-27 DIAGNOSIS — K0521 Aggressive periodontitis, localized: Secondary | ICD-10-CM

## 2014-12-27 DIAGNOSIS — K05219 Aggressive periodontitis, localized, unspecified severity: Secondary | ICD-10-CM

## 2014-12-27 MED ORDER — AMOXICILLIN 875 MG PO TABS: 875.0000 mg | ORAL_TABLET | Freq: Two times a day (BID) | ORAL | Status: AC

## 2014-12-27 NOTE — Patient Instructions (Signed)
Continue to use your mouthwash/listerine every 3 hours as needed. Followup with your dentist this week or the beginning of next week.   Finish the antibiotics completely.

## 2014-12-27 NOTE — Progress Notes (Signed)
    MRN: [id number] DOB: 03/16/1986  Subjective:   Chief Complaint  Patient presents with  . Gum Abcess    Left, bottom side. x6 days    Danielle Carr is a 29 y.o. female presenting for concern of a dental abscess that arose about 6 days ago.  She noted that initially it was painful.  She drew puss with pressure.  She used mouthwash listerine and warm salt water.  The swelling improved as well as pain, however she will have intermittent throbbing.  She denies present pain, fever, n/v, or ear pain.  She did have mild lymph node swelling, but has since resolved.    She is currently dental insured, however is awaiting card with policy number with the next few days.    Lelon MastSamantha has a current medication list which includes the following prescription(s): biotin, prednisolone acetate, and prenatal vit-min-fa-fish oil.  She is allergic to lavender oil and sulfa antibiotics.  Lelon MastSamantha  has no past medical history on file. Also  has past surgical history that includes No past surgeries.  ROS As in subjective.  Objective:   Vitals: BP 120/84 mmHg  Pulse 120  Temp(Src) 98.1 F (36.7 C) (Oral)  Resp 18  Ht 5' (1.524 m)  Wt 99 lb 3.2 oz (44.997 kg)  BMI 19.37 kg/m2  SpO2 100%  LMP 12/12/2014  Physical Exam  Constitutional: She is well-developed, well-nourished, and in no distress. No distress.  HENT:  Mouth/Throat: No uvula swelling.    Eyes: Conjunctivae are normal. Pupils are equal, round, and reactive to light.  Lymphadenopathy:       Head (right side): No submental, no submandibular, no tonsillar, no preauricular and no posterior auricular adenopathy present.       Head (left side): No submental, no submandibular, no tonsillar, no preauricular and no posterior auricular adenopathy present.    She has no cervical adenopathy.     Assessment and Plan :  29 year old female is here today for concern of swelling at bottom left gum.  The abscess is draining well.  Antibiotics  initiated and recommended to follow up with dentist within the next 10 days.    Periodontal abscess - Plan: amoxicillin (AMOXIL) 875 MG tablet  Trena PlattStephanie Victorine Mcnee, PA-C [facility] 1/5/20163:02 PM

## 2014-12-28 ENCOUNTER — Encounter: Payer: Self-pay | Admitting: Family Medicine

## 2014-12-28 NOTE — Progress Notes (Signed)
Patient discussed and examined with Danielle Carr. Agree with assessment and plan of care per her note.   

## 2015-02-08 LAB — OB RESULTS CONSOLE HEPATITIS B SURFACE ANTIGEN: Hepatitis B Surface Ag: NEGATIVE

## 2015-02-08 LAB — OB RESULTS CONSOLE RUBELLA ANTIBODY, IGM: Rubella: IMMUNE

## 2015-02-08 LAB — OB RESULTS CONSOLE ABO/RH: RH TYPE: POSITIVE

## 2015-02-08 LAB — OB RESULTS CONSOLE RPR: RPR: NONREACTIVE

## 2015-02-08 LAB — OB RESULTS CONSOLE ANTIBODY SCREEN: ANTIBODY SCREEN: NEGATIVE

## 2015-02-08 LAB — OB RESULTS CONSOLE HIV ANTIBODY (ROUTINE TESTING): HIV: NONREACTIVE

## 2015-02-21 ENCOUNTER — Other Ambulatory Visit (HOSPITAL_COMMUNITY)
Admission: RE | Admit: 2015-02-21 | Discharge: 2015-02-21 | Disposition: A | Discharge status: Home / Self Care | Payer: 59 | Source: Ambulatory Visit | Attending: Obstetrics and Gynecology | Admitting: Obstetrics and Gynecology

## 2015-02-21 ENCOUNTER — Other Ambulatory Visit: Payer: Self-pay | Admitting: Obstetrics and Gynecology

## 2015-02-21 DIAGNOSIS — Z113 Encounter for screening for infections with a predominantly sexual mode of transmission: Secondary | ICD-10-CM | POA: Insufficient documentation

## 2015-02-21 DIAGNOSIS — Z01419 Encounter for gynecological examination (general) (routine) without abnormal findings: Secondary | ICD-10-CM | POA: Diagnosis present

## 2015-02-22 LAB — CYTOLOGY - PAP

## 2015-09-13 ENCOUNTER — Inpatient Hospital Stay (HOSPITAL_COMMUNITY)
Admission: AD | Admit: 2015-09-13 | Discharge: 2015-09-13 | Disposition: A | Discharge status: Home / Self Care | Payer: 59 | Source: Ambulatory Visit | Attending: Obstetrics and Gynecology | Admitting: Obstetrics and Gynecology

## 2015-09-13 ENCOUNTER — Encounter (HOSPITAL_COMMUNITY): Payer: Self-pay | Admitting: *Deleted

## 2015-09-13 DIAGNOSIS — O288 Other abnormal findings on antenatal screening of mother: Secondary | ICD-10-CM

## 2015-09-13 DIAGNOSIS — O368130 Decreased fetal movements, third trimester, not applicable or unspecified: Secondary | ICD-10-CM | POA: Diagnosis not present

## 2015-09-13 DIAGNOSIS — Z3A38 38 weeks gestation of pregnancy: Secondary | ICD-10-CM | POA: Insufficient documentation

## 2015-09-13 DIAGNOSIS — O368131 Decreased fetal movements, third trimester, fetus 1: Secondary | ICD-10-CM

## 2015-09-13 NOTE — Discharge Instructions (Signed)

## 2015-09-13 NOTE — MAU Note (Signed)
Pt states she has had decreased fetal movement today, pt reports movement increased on her way here. Denies bleeding or ROM or pain.

## 2015-09-13 NOTE — MAU Provider Note (Signed)
  History     CSN: [id number]  Arrival date and time: 09/13/15 2105   First Provider Initiated Contact with Patient 09/13/15 2149      No chief complaint on file.  HPI Comments: Danielle Carr is a 29 y.o. [id number] at [redacted]w[redacted]d who presents today with decreased fetal movement. She states that she was told if she had less than 6 movements in one hour to call. She states that between 1700-1900 she had 6 movements. She denies any abdominal pain, vaginal bleeding, leaking of fluid. She states that on the car ride over here, and since she has been on the monitor the fetus has been moving normally. She denies any complications with this pregnancy.    History reviewed. No pertinent past medical history.  Past Surgical History  Procedure Laterality Date  . No past surgeries      History reviewed. No pertinent family history.  Social History  Substance Use Topics  . Smoking status: Never Smoker   . Smokeless tobacco: None  . Alcohol Use: No    Allergies:  Allergies  Allergen Reactions  . Lavender Oil Hives  . Sulfa Antibiotics Hives and Swelling    Prescriptions prior to admission  Medication Sig Dispense Refill Last Dose  . ferrous sulfate 325 (65 FE) MG tablet Take 325 mg by mouth daily with breakfast.   Past Month at Unknown time    Review of Systems  Constitutional: Negative for fever.  Gastrointestinal: Positive for diarrhea (three watery stools today. She had it on Friday as well, but it resolved on it's own. ). Negative for nausea, vomiting, abdominal pain and constipation.  Genitourinary: Negative for dysuria, urgency and frequency.   Physical Exam   Blood pressure 125/71, pulse 96, temperature 98.4 F (36.9 C), temperature source Oral, resp. rate 16, height  (1.499 m), weight 58.06 kg (128 lb), last menstrual period 12/12/2014, SpO2 100 %, unknown if currently breastfeeding.  Physical Exam  FHT 130, moderate with 15x15 accels, no decels Toco: no UCs  MAU  Course  Procedures  MDM 2212: D/W Dr. Sallye Ober, and she reviewed NST. Ok for DC home.   Assessment and Plan   1. Decreased fetal movement, third trimester, fetus 1   2. Non-reactive NST (non-stress test)    DC home 3rd Trimester precautions  Labor precautions  Fetal kick counts RX: none  Return to MAU as needed   Follow-up Information    Follow up with [name], MD.   Specialty:  Obstetrics and Gynecology   Why:  As scheduled   Contact information:   [address] [place] [address] [phone]         Danielle Carr 09/13/2015, 9:49 PM   Fetal heart tracing was normal and reactive.  Patient felt good and normal fetal movements while in MAU.  She was deemed stable for discharge with fetal kick counts precautions.  Dr. Sallye Ober.

## 2015-09-25 ENCOUNTER — Inpatient Hospital Stay (HOSPITAL_COMMUNITY)
Admission: AD | Admit: 2015-09-25 | Discharge: 2015-09-25 | Disposition: A | Discharge status: Home / Self Care | Payer: 59 | Source: Ambulatory Visit | Attending: Obstetrics and Gynecology | Admitting: Obstetrics and Gynecology

## 2015-09-25 ENCOUNTER — Encounter (HOSPITAL_COMMUNITY): Payer: Self-pay | Admitting: *Deleted

## 2015-09-25 NOTE — MAU Note (Signed)
Feels abdominal tightening and pressure. Low back pain. Pink "slimy" D/C.

## 2015-09-28 ENCOUNTER — Encounter (HOSPITAL_COMMUNITY): Payer: Self-pay

## 2015-09-28 ENCOUNTER — Inpatient Hospital Stay (HOSPITAL_COMMUNITY)
Admission: AD | Admit: 2015-09-28 | Discharge: 2015-09-29 | DRG: 775 | Disposition: A | Discharge status: Home / Self Care | Payer: 59 | Source: Ambulatory Visit | Attending: Obstetrics & Gynecology | Admitting: Obstetrics & Gynecology

## 2015-09-28 DIAGNOSIS — O9902 Anemia complicating childbirth: Secondary | ICD-10-CM | POA: Diagnosis present

## 2015-09-28 DIAGNOSIS — D649 Anemia, unspecified: Secondary | ICD-10-CM | POA: Diagnosis present

## 2015-09-28 DIAGNOSIS — O99824 Streptococcus B carrier state complicating childbirth: Secondary | ICD-10-CM | POA: Diagnosis present

## 2015-09-28 DIAGNOSIS — Z3A40 40 weeks gestation of pregnancy: Secondary | ICD-10-CM | POA: Diagnosis not present

## 2015-09-28 DIAGNOSIS — B951 Streptococcus, group B, as the cause of diseases classified elsewhere: Secondary | ICD-10-CM | POA: Diagnosis present

## 2015-09-28 DIAGNOSIS — Z3490 Encounter for supervision of normal pregnancy, unspecified, unspecified trimester: Secondary | ICD-10-CM

## 2015-09-28 LAB — CBC
HEMATOCRIT: 32.7 % — AB (ref 36.0–46.0)
HEMOGLOBIN: 9.5 g/dL — AB (ref 12.0–15.0)
MCH: 19.5 pg — AB (ref 26.0–34.0)
MCHC: 29.1 g/dL — AB (ref 30.0–36.0)
MCV: 67.1 fL — AB (ref 78.0–100.0)
Platelets: 333 10*3/uL (ref 150–400)
RBC: 4.87 MIL/uL (ref 3.87–5.11)
RDW: 18.6 % — ABNORMAL HIGH (ref 11.5–15.5)
WBC: 11.4 10*3/uL — ABNORMAL HIGH (ref 4.0–10.5)

## 2015-09-28 LAB — TYPE AND SCREEN
ABO/RH(D): AB POS
Antibody Screen: NEGATIVE

## 2015-09-28 LAB — RPR: RPR: NONREACTIVE

## 2015-09-28 MED ORDER — LACTATED RINGERS IV SOLN: 500.0000 mL | INTRAVENOUS | Status: DC | PRN

## 2015-09-28 MED ORDER — SENNOSIDES-DOCUSATE SODIUM 8.6-50 MG PO TABS
2.0000 | ORAL_TABLET | ORAL | Status: DC
  Administered 2015-09-28: 2 via ORAL
  Filled 2015-09-28: qty 2

## 2015-09-28 MED ORDER — HYDROMORPHONE HCL 1 MG/ML IJ SOLN
INTRAMUSCULAR | Status: AC
  Filled 2015-09-28: qty 2

## 2015-09-28 MED ORDER — FLEET ENEMA 7-19 GM/118ML RE ENEM: 1.0000 | ENEMA | RECTAL | Status: DC | PRN

## 2015-09-28 MED ORDER — DIBUCAINE 1 % RE OINT: 1.0000 "application " | TOPICAL_OINTMENT | RECTAL | Status: DC | PRN

## 2015-09-28 MED ORDER — ACETAMINOPHEN 325 MG PO TABS: 650.0000 mg | ORAL_TABLET | ORAL | Status: DC | PRN

## 2015-09-28 MED ORDER — WITCH HAZEL-GLYCERIN EX PADS: 1.0000 "application " | MEDICATED_PAD | CUTANEOUS | Status: DC | PRN

## 2015-09-28 MED ORDER — LACTATED RINGERS IV SOLN
INTRAVENOUS | Status: DC
  Administered 2015-09-28: 07:00:00 via INTRAVENOUS

## 2015-09-28 MED ORDER — OXYCODONE-ACETAMINOPHEN 5-325 MG PO TABS: 1.0000 | ORAL_TABLET | ORAL | Status: DC | PRN

## 2015-09-28 MED ORDER — LANOLIN HYDROUS EX OINT: TOPICAL_OINTMENT | CUTANEOUS | Status: DC | PRN

## 2015-09-28 MED ORDER — SODIUM CHLORIDE 0.9 % IV SOLN
2.0000 g | Freq: Four times a day (QID) | INTRAVENOUS | Status: DC
  Administered 2015-09-28: 2 g via INTRAVENOUS
  Filled 2015-09-28 (×3): qty 2000

## 2015-09-28 MED ORDER — LIDOCAINE HCL (PF) 1 % IJ SOLN
30.0000 mL | INTRAMUSCULAR | Status: AC | PRN
  Administered 2015-09-28 (×2): 30 mL via SUBCUTANEOUS
  Filled 2015-09-28 (×2): qty 30

## 2015-09-28 MED ORDER — TETANUS-DIPHTH-ACELL PERTUSSIS 5-2.5-18.5 LF-MCG/0.5 IM SUSP: 0.5000 mL | Freq: Once | INTRAMUSCULAR | Status: DC

## 2015-09-28 MED ORDER — IBUPROFEN 600 MG PO TABS
600.0000 mg | ORAL_TABLET | Freq: Four times a day (QID) | ORAL | Status: DC
  Administered 2015-09-28 – 2015-09-29 (×4): 600 mg via ORAL
  Filled 2015-09-28 (×4): qty 1

## 2015-09-28 MED ORDER — TERBUTALINE SULFATE 1 MG/ML IJ SOLN: 0.2500 mg | Freq: Once | INTRAMUSCULAR | Status: DC | PRN

## 2015-09-28 MED ORDER — OXYCODONE-ACETAMINOPHEN 5-325 MG PO TABS: 2.0000 | ORAL_TABLET | ORAL | Status: DC | PRN

## 2015-09-28 MED ORDER — OXYTOCIN 40 UNITS IN LACTATED RINGERS INFUSION - SIMPLE MED
62.5000 mL/h | INTRAVENOUS | Status: DC
  Filled 2015-09-28: qty 1000

## 2015-09-28 MED ORDER — MISOPROSTOL 200 MCG PO TABS
ORAL_TABLET | ORAL | Status: AC
  Filled 2015-09-28: qty 5

## 2015-09-28 MED ORDER — ZOLPIDEM TARTRATE 5 MG PO TABS: 5.0000 mg | ORAL_TABLET | Freq: Every evening | ORAL | Status: DC | PRN

## 2015-09-28 MED ORDER — BENZOCAINE-MENTHOL 20-0.5 % EX AERO
1.0000 "application " | INHALATION_SPRAY | CUTANEOUS | Status: DC | PRN
  Administered 2015-09-28: 1 via TOPICAL
  Filled 2015-09-28: qty 56

## 2015-09-28 MED ORDER — OXYTOCIN 40 UNITS IN LACTATED RINGERS INFUSION - SIMPLE MED
1.0000 m[IU]/min | INTRAVENOUS | Status: DC
  Administered 2015-09-28: 1 m[IU]/min via INTRAVENOUS

## 2015-09-28 MED ORDER — PRENATAL MULTIVITAMIN CH
1.0000 | ORAL_TABLET | Freq: Every day | ORAL | Status: DC
  Administered 2015-09-29: 1 via ORAL
  Filled 2015-09-28: qty 1

## 2015-09-28 MED ORDER — HYDROMORPHONE HCL 1 MG/ML IJ SOLN: 2.0000 mg | Freq: Once | INTRAMUSCULAR | Status: DC

## 2015-09-28 MED ORDER — ONDANSETRON HCL 4 MG/2ML IJ SOLN: 4.0000 mg | INTRAMUSCULAR | Status: DC | PRN

## 2015-09-28 MED ORDER — SODIUM CHLORIDE 0.9 % IV SOLN
2.0000 g | Freq: Once | INTRAVENOUS | Status: AC
  Administered 2015-09-28: 2 g via INTRAVENOUS
  Filled 2015-09-28: qty 2000

## 2015-09-28 MED ORDER — LACTATED RINGERS IV SOLN
INTRAVENOUS | Status: DC
  Administered 2015-09-28 (×2): via INTRAVENOUS

## 2015-09-28 MED ORDER — CITRIC ACID-SODIUM CITRATE 334-500 MG/5ML PO SOLN: 30.0000 mL | ORAL | Status: DC | PRN

## 2015-09-28 MED ORDER — FERROUS SULFATE 325 (65 FE) MG PO TABS
325.0000 mg | ORAL_TABLET | Freq: Every day | ORAL | Status: DC
  Administered 2015-09-29: 325 mg via ORAL
  Filled 2015-09-28: qty 1

## 2015-09-28 MED ORDER — OXYCODONE-ACETAMINOPHEN 5-325 MG PO TABS
2.0000 | ORAL_TABLET | ORAL | Status: DC | PRN
Start: 2015-09-28 — End: 2015-09-28

## 2015-09-28 MED ORDER — DIPHENHYDRAMINE HCL 25 MG PO CAPS: 25.0000 mg | ORAL_CAPSULE | Freq: Four times a day (QID) | ORAL | Status: DC | PRN

## 2015-09-28 MED ORDER — ONDANSETRON HCL 4 MG PO TABS: 4.0000 mg | ORAL_TABLET | ORAL | Status: DC | PRN

## 2015-09-28 MED ORDER — SIMETHICONE 80 MG PO CHEW: 80.0000 mg | CHEWABLE_TABLET | ORAL | Status: DC | PRN

## 2015-09-28 MED ORDER — OXYTOCIN BOLUS FROM INFUSION: 500.0000 mL | INTRAVENOUS | Status: DC

## 2015-09-28 MED ORDER — ONDANSETRON HCL 4 MG/2ML IJ SOLN: 4.0000 mg | Freq: Four times a day (QID) | INTRAMUSCULAR | Status: DC | PRN

## 2015-09-28 NOTE — MAU Note (Signed)
Pt reports vaginal bleeding at 0515, cramping started at that time.

## 2015-09-28 NOTE — Progress Notes (Signed)
OB PN:  S: Pt resting comfortably, no acute complaints  O: BP 127/70 mmHg  Pulse 88  Temp(Src) 98.4 F (36.9 C) (Oral)  Resp 17  Ht  (1.499 m)  Wt 58.06 kg (128 lb)  BMI 25.84 kg/m2  SpO2 100%  LMP 12/12/2014  FHT: 135bpm, moderate variablity, + accels, no decels Toco: q3-7min SVE: 8-9/100/-1, AROM clear fluid  A/P: 29 y.o. [id number] @ [redacted]w[redacted]d in active labor 1. FWB: Cat. I 2. Labor: continue Pit per protocol GBS: positive, s/p Amp @ 0800  Myna Hidalgo, [place] [phone] (pager) [phone] (office)

## 2015-09-28 NOTE — H&P (Signed)
HPI: 29 y/o G4P1021 @ [redacted]w[redacted]d estimated gestational age presents complaining of vaginal spotting and occasional contractions that started around 0515 noeaking of Fluid,   +Vaginal Bleeding,   Occasional uterine Contractions,  + Fetal Movement.  ROS: no HA, no epigastric pain, no visual changes.    Pregnancy complicated by: 1) GBS bacteruria 2) Anemia- Last Hgb on 6/28L Hgb 8.3   Prenatal Transfer Tool  Maternal Diabetes: No Genetic Screening: Declined Maternal Ultrasounds/Referrals: Normal Fetal Ultrasounds or other Referrals:  None Maternal Substance Abuse:  No Significant Maternal Medications:  None Significant Maternal Lab Results: Lab values include: Group B Strep positive   PNL:  GBS positive, Rub Immune, Hep B neg, RPR NR, HIV neg, GC/C neg, glucola:133 (normal) Hgb: 8.3 Blood type: AB positive, antibody negative  OBHx:  -NSVD x 1, 7#0, uncomplicated -SAB x2 PMHx:  none Meds:  PNV, iron  Allergy:   Allergies  Allergen Reactions  . Lavender Oil Hives  . Sulfa Antibiotics Hives and Swelling   SurgHx: none SocHx:   no Tobacco, no  EtOH, no Illicit Drugs  O: BP 128/83 mmHg  Pulse 83  Temp(Src) 98.1 F (36.7 C) (Oral)  Resp 20  Ht  (1.499 m)  Wt 58.06 kg (128 lb)  BMI 25.84 kg/m2  SpO2 100%  LMP 12/12/2014 Gen. AAOx3, NAD CV.  RRR  No murmur.  Abd. Gravid,  no tenderness Extr.  no edema B/L , no calf tenderness  FHT: 125 baseline, modertae variability, no accels,  no decels Toco: irregular  SVE: 8/100/-1, intact, vertex (per RN in MAU)   Labs: see orders  A/P:  29 y.o. [id number] @ [redacted]w[redacted]d EGA who presents for vaginal bleeding in active labor -FWB:  NICHD Cat I FHTs -Labor: expectant management, consider Pit if contractions are not adequate -GBS: positive, plan for Amp due to advance labor -CBC, T&S to be obtained  Myna Hidalgo, DO [phone] (pager) [phone] (office)

## 2015-09-29 DIAGNOSIS — B951 Streptococcus, group B, as the cause of diseases classified elsewhere: Secondary | ICD-10-CM | POA: Diagnosis present

## 2015-09-29 LAB — CBC
HEMATOCRIT: 25.5 % — AB (ref 36.0–46.0)
HEMOGLOBIN: 7.5 g/dL — AB (ref 12.0–15.0)
MCH: 19.7 pg — AB (ref 26.0–34.0)
MCHC: 29.4 g/dL — ABNORMAL LOW (ref 30.0–36.0)
MCV: 67.1 fL — ABNORMAL LOW (ref 78.0–100.0)
Platelets: 290 10*3/uL (ref 150–400)
RBC: 3.8 MIL/uL — AB (ref 3.87–5.11)
RDW: 18.5 % — ABNORMAL HIGH (ref 11.5–15.5)
WBC: 14.7 10*3/uL — ABNORMAL HIGH (ref 4.0–10.5)

## 2015-09-29 MED ORDER — IBUPROFEN 600 MG PO TABS: 600.0000 mg | ORAL_TABLET | Freq: Four times a day (QID) | ORAL | Status: DC

## 2015-09-29 NOTE — Discharge Summary (Signed)
Obstetric Discharge Summary Reason for Admission: onset of labor Prenatal Procedures: none Intrapartum Procedures: spontaneous vaginal delivery Postpartum Procedures: none Complications-Operative and Postpartum: 2nd degree perineal laceration HEMOGLOBIN  Date Value Ref Range Status  09/29/2015 7.5* 12.0 - 15.0 g/dL Final    Comment:    DELTA CHECK NOTED REPEATED TO VERIFY    HCT  Date Value Ref Range Status  09/29/2015 25.5* 36.0 - 46.0 % Final    Physical Exam:  General: alert, cooperative and no distress Lochia: appropriate Uterine Fundus: firm Incision: n/a DVT Evaluation: No evidence of DVT seen on physical exam. Calf/Ankle edema is present.  Discharge Diagnoses: Term Pregnancy-delivered  Discharge Information: Date: 09/29/2015 Activity: pelvic rest Diet: routine Medications: PNV and Ibuprofen Condition: stable Instructions: See discharge instructions. Discharge to: home Follow-up Information    Follow up with Danielle Carr., MD In 6 weeks.   Specialty:  Obstetrics and Gynecology   Why:  Postpartum check up   Contact information:   [address] [place] [address] [phone]       Newborn Data: Live born female  Birth Weight: 6 lb 5.4 oz (2875 g) APGAR: 9, 9  Home with mother.  Danielle Carr 09/29/2015, 5:11 PM

## 2015-09-29 NOTE — Discharge Instructions (Signed)

## 2015-10-02 ENCOUNTER — Inpatient Hospital Stay (HOSPITAL_COMMUNITY): Payer: 59

## 2017-10-07 ENCOUNTER — Other Ambulatory Visit (HOSPITAL_COMMUNITY)
Admission: RE | Admit: 2017-10-07 | Discharge: 2017-10-07 | Disposition: A | Discharge status: Home / Self Care | Payer: BLUE CROSS/BLUE SHIELD | Source: Ambulatory Visit | Attending: Nurse Practitioner | Admitting: Nurse Practitioner

## 2017-10-07 ENCOUNTER — Other Ambulatory Visit: Payer: Self-pay | Admitting: Nurse Practitioner

## 2017-10-07 DIAGNOSIS — Z01419 Encounter for gynecological examination (general) (routine) without abnormal findings: Secondary | ICD-10-CM | POA: Diagnosis present

## 2017-10-10 LAB — CYTOLOGY - PAP
DIAGNOSIS: NEGATIVE
HPV: NOT DETECTED

## 2018-04-08 ENCOUNTER — Inpatient Hospital Stay (HOSPITAL_COMMUNITY): Payer: BLUE CROSS/BLUE SHIELD

## 2018-04-08 ENCOUNTER — Inpatient Hospital Stay (HOSPITAL_COMMUNITY)
Admission: AD | Admit: 2018-04-08 | Discharge: 2018-04-08 | Disposition: A | Discharge status: Home / Self Care | Payer: BLUE CROSS/BLUE SHIELD | Source: Ambulatory Visit | Attending: Obstetrics and Gynecology | Admitting: Obstetrics and Gynecology

## 2018-04-08 ENCOUNTER — Encounter (HOSPITAL_COMMUNITY): Payer: Self-pay

## 2018-04-08 DIAGNOSIS — O3680X0 Pregnancy with inconclusive fetal viability, not applicable or unspecified: Secondary | ICD-10-CM

## 2018-04-08 DIAGNOSIS — R1030 Lower abdominal pain, unspecified: Secondary | ICD-10-CM | POA: Insufficient documentation

## 2018-04-08 DIAGNOSIS — K5900 Constipation, unspecified: Secondary | ICD-10-CM | POA: Diagnosis not present

## 2018-04-08 DIAGNOSIS — O99611 Diseases of the digestive system complicating pregnancy, first trimester: Secondary | ICD-10-CM | POA: Diagnosis not present

## 2018-04-08 DIAGNOSIS — O26891 Other specified pregnancy related conditions, first trimester: Secondary | ICD-10-CM | POA: Insufficient documentation

## 2018-04-08 DIAGNOSIS — Z3A01 Less than 8 weeks gestation of pregnancy: Secondary | ICD-10-CM

## 2018-04-08 DIAGNOSIS — Z882 Allergy status to sulfonamides status: Secondary | ICD-10-CM | POA: Diagnosis not present

## 2018-04-08 DIAGNOSIS — Z3201 Encounter for pregnancy test, result positive: Secondary | ICD-10-CM | POA: Insufficient documentation

## 2018-04-08 DIAGNOSIS — Z91048 Other nonmedicinal substance allergy status: Secondary | ICD-10-CM | POA: Diagnosis not present

## 2018-04-08 DIAGNOSIS — R109 Unspecified abdominal pain: Secondary | ICD-10-CM | POA: Diagnosis not present

## 2018-04-08 LAB — CBC
HEMATOCRIT: 36.1 % (ref 36.0–46.0)
Hemoglobin: 12 g/dL (ref 12.0–15.0)
MCH: 26.8 pg (ref 26.0–34.0)
MCHC: 33.2 g/dL (ref 30.0–36.0)
MCV: 80.8 fL (ref 78.0–100.0)
PLATELETS: 258 10*3/uL (ref 150–400)
RBC: 4.47 MIL/uL (ref 3.87–5.11)
RDW: 13.2 % (ref 11.5–15.5)
WBC: 9.4 10*3/uL (ref 4.0–10.5)

## 2018-04-08 LAB — HCG, QUANTITATIVE, PREGNANCY: hCG, Beta Chain, Quant, S: 868 m[IU]/mL — ABNORMAL HIGH (ref <5)

## 2018-04-08 LAB — URINALYSIS, ROUTINE W REFLEX MICROSCOPIC
BILIRUBIN URINE: NEGATIVE
Glucose, UA: NEGATIVE mg/dL
Hgb urine dipstick: NEGATIVE
Ketones, ur: 5 mg/dL — AB
LEUKOCYTES UA: NEGATIVE
NITRITE: NEGATIVE
PH: 6 (ref 5.0–8.0)
Protein, ur: NEGATIVE mg/dL
SPECIFIC GRAVITY, URINE: 1.006 (ref 1.005–1.030)

## 2018-04-08 LAB — POCT PREGNANCY, URINE: Preg Test, Ur: POSITIVE — AB

## 2018-04-08 NOTE — MAU Note (Signed)
Pt reports lower abdominal cramping that started Monday. States it is much worse today. Rates 6/10. Has not taken anything. Pt is also reporting a lot of vaginal pressure. Pt denies vaginal bleeding. LMP: 02/22/2018

## 2018-04-08 NOTE — MAU Provider Note (Signed)
History     CSN: [id number]  Arrival date and time: 04/08/18 8469   First Provider Initiated Contact with Patient 04/08/18 1946      Chief Complaint  Patient presents with  . Abdominal Pain  . Possible Pregnancy   HPI  Danielle Carr is a 32 y.o. [id number] at [redacted]w[redacted]d by LMP who presents with abdominal pain. Was seen at [facility] for nurse visit this week & has MD appt in 3 weeks.  Lower abdominal cramping started on Monday. Pain has worsened today. Describes as cramping and pressure that is worse in RLQ. Rates pain 6/10. Has not treated symptoms. Nothing makes better or worse. Denies vaginal bleeding, dysuria, fever/chills, vaginal discharge, or n/v. Endorses some constipation. Has had 2 BM today that were small & hard. Is not treating constipation.   OB History    Gravida  5   Para  2   Term  2   Preterm      AB  2   Living  2     SAB  2   TAB      Ectopic      Multiple  0   Live Births  2           No past medical history on file.  Past Surgical History:  Procedure Laterality Date  . NO PAST SURGERIES      No family history on file.  Social History   Tobacco Use  . Smoking status: Never Smoker  Substance Use Topics  . Alcohol use: No  . Drug use: No    Allergies:  Allergies  Allergen Reactions  . Lavender Oil Hives  . Sulfa Antibiotics Hives and Swelling    Medications Prior to Admission  Medication Sig Dispense Refill Last Dose  . BIOTIN PO Take 2 each by mouth daily. Gummy Biotin   04/07/2018 at Unknown time  . Prenatal Vit-Fe Fumarate-FA (PRENATAL MULTIVITAMIN) TABS tablet Take 1 tablet by mouth daily at 12 noon.   04/08/2018 at Unknown time  . ibuprofen (ADVIL,MOTRIN) 600 MG tablet Take 1 tablet (600 mg total) by mouth every 6 (six) hours. (Patient not taking: Reported on 04/08/2018) 30 tablet 0 Not Taking at Unknown time    Review of Systems  Constitutional: Negative.   Gastrointestinal: Positive for abdominal pain and constipation.  Negative for blood in stool, diarrhea, nausea and vomiting.  Genitourinary: Negative.    Physical Exam   Blood pressure 118/74, pulse 98, temperature 98.6 F (37 C), temperature source Oral, resp. rate 16, height 4\' 11"  (1.499 m), weight 111 lb (50.3 kg), last menstrual period 02/22/2018, SpO2 100 %, unknown if currently breastfeeding.  Physical Exam  Nursing note and vitals reviewed. Constitutional: She is oriented to person, place, and time. She appears well-developed and well-nourished. No distress.  HENT:  Head: Normocephalic and atraumatic.  Eyes: Conjunctivae are normal. Right eye exhibits no discharge. Left eye exhibits no discharge. No scleral icterus.  Neck: Normal range of motion.  Respiratory: Effort normal. No respiratory distress.  GI: Soft. She exhibits no distension. There is tenderness in the right lower quadrant. There is no rigidity, no rebound and no guarding.  Genitourinary: Uterus normal. Cervix exhibits no motion tenderness. Left adnexum displays no mass and no tenderness. No bleeding in the vagina.  Genitourinary Comments: Cervix closed  Neurological: She is alert and oriented to person, place, and time.  Skin: Skin is warm and dry. She is not diaphoretic.  Psychiatric: She has a normal mood  and affect. Her behavior is normal. Judgment and thought content normal.    MAU Course  Procedures Results for orders placed or performed during the hospital encounter of 04/08/18 (from the past 24 hour(s))  Urinalysis, Routine w reflex microscopic     Status: Abnormal   Collection Time: 04/08/18  7:26 PM  Result Value Ref Range   Color, Urine STRAW (A) YELLOW   APPearance CLEAR CLEAR   Specific Gravity, Urine 1.006 1.005 - 1.030   pH 6.0 5.0 - 8.0   Glucose, UA NEGATIVE NEGATIVE mg/dL   Hgb urine dipstick NEGATIVE NEGATIVE   Bilirubin Urine NEGATIVE NEGATIVE   Ketones, ur 5 (A) NEGATIVE mg/dL   Protein, ur NEGATIVE NEGATIVE mg/dL   Nitrite NEGATIVE NEGATIVE    Leukocytes, UA NEGATIVE NEGATIVE  Pregnancy, urine POC     Status: Abnormal   Collection Time: 04/08/18  7:37 PM  Result Value Ref Range   Preg Test, Ur POSITIVE (A) NEGATIVE  CBC     Status: None   Collection Time: 04/08/18  8:12 PM  Result Value Ref Range   WBC 9.4 4.0 - 10.5 K/uL   RBC 4.47 3.87 - 5.11 MIL/uL   Hemoglobin 12.0 12.0 - 15.0 g/dL   HCT 78.236.1 95.636.0 - 21.346.0 %   MCV 80.8 78.0 - 100.0 fL   MCH 26.8 26.0 - 34.0 pg   MCHC 33.2 30.0 - 36.0 g/dL   RDW 08.613.2 57.811.5 - 46.915.5 %   Platelets 258 150 - 400 K/uL  hCG, quantitative, pregnancy     Status: Abnormal   Collection Time: 04/08/18  8:12 PM  Result Value Ref Range   hCG, Beta Chain, Quant, S 868 (H) <5 mIU/mL   [place] Ob Less Than 14 Weeks With Ob Transvaginal  Result Date: 04/08/2018 CLINICAL DATA:  Positive urine pregnancy test.  Cramping. EXAM: OBSTETRIC <14 WK US AND TRANSVAGINAL OB US TECHNIQUE: Both transabdominal and transvaginal ultrasound examinations were performed for complete evaluation of the gestation as well as the maternal uterus, adnexal regions, and pelvic cul-de-sac. Transvaginal technique was performed to assess early pregnancy. COMPARISON:  Ultrasound 08/15/2014 FINDINGS: Intrauterine gestational sac: In no intrauterine gestational sac identified Yolk sac:  Absent Embryo:  Absent Cardiac Activity: Absent Subchorionic hemorrhage:  None visualized. Maternal uterus/adnexae: Normal ovaries. Complex fluid collection in the posterior cul-de-sac. IMPRESSION: 1. No intrauterine gestation. 2. Complex fluid the posterior cul-de-sac 3. No intrauterine gestational sac, yolk sac, or fetal pole identified. Differential considerations include intrauterine pregnancy too early to be sonographically visualized, missed abortion, or ectopic pregnancy. Followup ultrasound is recommended in 10-14 days for further evaluation. Electronically Signed   By: Genevive BiStewart  Edmunds M.D.   On: 04/08/2018 20:59     MDM CBC, BHCG, & ultrasound  ordered VSS, NAD. Cervix closed.   CBC, no leukocytosis & hemoglobin 12.0  HCG 868, Ultrasound shows no IUGS or adnexal mass but does show moderate amount of complex fluid in the posterior cul-de-sac  Discussed results with Dr. Normand Sloopillard. Will give patient return precautions & have her come to MAU on Friday for repeat HCG (office closed for holiday weekend)  Discussed results with patient. Complex fluid could be from ruptured ovarian cyst but can't exclude possibility of ectopic pregnancy. Her pain could be due to ?ruptured ovarian cyst, SAB, ectopic pregnancy, or constipation. Informed that ectopic pregnancy can be life threatening & reviewed reasons to return immediately to MAU. Pt verbalized understanding.  Assessment and Plan  A:  1. Pregnancy of unknown anatomic location  2. Abdominal pain during pregnancy in first trimester   3. Constipation during pregnancy in first trimester    P: Discharge home Strict return precautions for s/s of ectopic pregnancy Return to MAU on Friday for repeat HCG & CBC (orders entered in sign & held) Discussed ways to treat constipation including increase fiber & water, colace, prune juice Call [facility] with any questions or concerns   Judeth Horn 04/08/2018, 7:46 PM

## 2018-04-08 NOTE — Discharge Instructions (Signed)
Your abdominal pain could be caused by miscarriage, ovarian cyst, ectopic pregnancy, or constipation. We want you to return to MAU on Friday to repeat your pregnancy hormone level lab.   Return to MAU immediately for the following symptoms: -Worsening abdominal pain -Abdominal pain that radiates to shoulder or down legs -If you pass out or feel like you are going to pass out -If you have vaginal bleeding that saturates more than 2 pads per hour for more than 2 hours -You have fever of 100.5 or greater  Call your ob/gyn if you have any other concerns   Constipation, Adult Constipation is when a person has fewer bowel movements in a week than normal, has difficulty having a bowel movement, or has stools that are dry, hard, or larger than normal. Constipation may be caused by an underlying condition. It may become worse with age if a person takes certain medicines and does not take in enough fluids. Follow these instructions at home: Eating and drinking   Eat foods that have a lot of fiber, such as fresh fruits and vegetables, whole grains, and beans.  Limit foods that are high in fat, low in fiber, or overly processed, such as french fries, hamburgers, cookies, candies, and soda.  Drink enough fluid to keep your urine clear or pale yellow. General instructions  Exercise regularly or as told by your health care provider.  Go to the restroom when you have the urge to go. Do not hold it in.  Take over-the-counter and prescription medicines only as told by your health care provider. These include any fiber supplements.  Practice pelvic floor retraining exercises, such as deep breathing while relaxing the lower abdomen and pelvic floor relaxation during bowel movements.  Watch your condition for any changes.  Keep all follow-up visits as told by your health care provider. This is important. Contact a health care provider if:  You have pain that gets worse.  You have a fever.  You  do not have a bowel movement after 4 days.  You vomit.  You are not hungry.  You lose weight.  You are bleeding from the anus.  You have thin, pencil-like stools. Get help right away if:  You have a fever and your symptoms suddenly get worse.  You leak stool or have blood in your stool.  Your abdomen is bloated.  You have severe pain in your abdomen.  You feel dizzy or you faint. This information is not intended to replace advice given to you by your health care provider. Make sure you discuss any questions you have with your health care provider. Document Released: 09/06/2004 Document Revised: 06/28/2016 Document Reviewed: 05/29/2016 Elsevier Interactive Patient Education  2018 [employer]

## 2018-04-10 ENCOUNTER — Inpatient Hospital Stay (HOSPITAL_COMMUNITY)
Admission: AD | Admit: 2018-04-10 | Discharge: 2018-04-10 | Disposition: A | Discharge status: Home / Self Care | Payer: BLUE CROSS/BLUE SHIELD | Source: Ambulatory Visit | Attending: Obstetrics & Gynecology | Admitting: Obstetrics & Gynecology

## 2018-04-10 DIAGNOSIS — Z3A01 Less than 8 weeks gestation of pregnancy: Secondary | ICD-10-CM | POA: Diagnosis not present

## 2018-04-10 DIAGNOSIS — O039 Complete or unspecified spontaneous abortion without complication: Secondary | ICD-10-CM | POA: Insufficient documentation

## 2018-04-10 DIAGNOSIS — O26891 Other specified pregnancy related conditions, first trimester: Secondary | ICD-10-CM | POA: Diagnosis present

## 2018-04-10 LAB — CBC
HCT: 37.4 % (ref 36.0–46.0)
Hemoglobin: 12.4 g/dL (ref 12.0–15.0)
MCH: 26.8 pg (ref 26.0–34.0)
MCHC: 33.2 g/dL (ref 30.0–36.0)
MCV: 80.8 fL (ref 78.0–100.0)
PLATELETS: 262 10*3/uL (ref 150–400)
RBC: 4.63 MIL/uL (ref 3.87–5.11)
RDW: 13.5 % (ref 11.5–15.5)
WBC: 9 10*3/uL (ref 4.0–10.5)

## 2018-04-10 LAB — HCG, QUANTITATIVE, PREGNANCY: hCG, Beta Chain, Quant, S: 669 m[IU]/mL — ABNORMAL HIGH (ref <5)

## 2018-04-10 NOTE — MAU Note (Signed)
Steward DroneVeronica Rogers CNM in to discuss lab work with pt. Written and verbal d/c instructions given and  Understanding voiced. Emotional support and comfort info given to pt

## 2018-04-10 NOTE — Discharge Instructions (Signed)

## 2018-04-10 NOTE — MAU Provider Note (Signed)
Chief Complaint: Follow-up  SUBJECTIVE HPI: Danielle Carr is a 32 y.o. [id number] at [redacted]w[redacted]d by LMP who presents to maternity admissions reporting HCG follow up. She was seen in MAU on 4/17 for possible pregnancy and abdominal pain. She denies vaginal bleeding or continued abdominal cramping. She receives care at [facility] and sees Dr Richardson Dopp which she has an appointment scheduled in 3 weeks.   No past medical history on file. Past Surgical History:  Procedure Laterality Date  . NO PAST SURGERIES     Social History   Socioeconomic History  . Marital status: Married    Spouse name: Not on file  . Number of children: Not on file  . Years of education: Not on file  . Highest education level: Not on file  Occupational History  . Not on file  Social Needs  . Financial resource strain: Not on file  . Food insecurity:    Worry: Not on file    Inability: Not on file  . Transportation needs:    Medical: Not on file    Non-medical: Not on file  Tobacco Use  . Smoking status: Never Smoker  Substance and Sexual Activity  . Alcohol use: No  . Drug use: No  . Sexual activity: Yes  Lifestyle  . Physical activity:    Days per week: Not on file    Minutes per session: Not on file  . Stress: Not on file  Relationships  . Social connections:    Talks on phone: Not on file    Gets together: Not on file    Attends religious service: Not on file    Active member of club or organization: Not on file    Attends meetings of clubs or organizations: Not on file    Relationship status: Not on file  . Intimate partner violence:    Fear of current or ex partner: Not on file    Emotionally abused: Not on file    Physically abused: Not on file    Forced sexual activity: Not on file  Other Topics Concern  . Not on file  Social History Narrative  . Not on file   No current facility-administered medications on file prior to encounter.    Current Outpatient Medications on File Prior to Encounter   Medication Sig Dispense Refill  . BIOTIN PO Take 2 each by mouth daily. Gummy Biotin    . Prenatal Vit-Fe Fumarate-FA (PRENATAL MULTIVITAMIN) TABS tablet Take 1 tablet by mouth daily at 12 noon.     Allergies  Allergen Reactions  . Lavender Oil Hives  . Sulfa Antibiotics Hives and Swelling    ROS:  Review of Systems  Respiratory: Negative.   Cardiovascular: Negative.   Gastrointestinal: Negative for abdominal pain, constipation, diarrhea, nausea and vomiting.  Genitourinary: Negative for vaginal bleeding and vaginal discharge.   I have reviewed patient's Past Medical Hx, Surgical Hx, Family Hx, Social Hx, medications and allergies.   Physical Exam   Patient Vitals for the past 24 hrs:  BP Temp Temp src Pulse Resp SpO2 Weight  04/10/18 1834 105/76 98.3 F (36.8 C) Oral 92 16 99 % 110 lb (49.9 kg)   Constitutional: Well-developed, well-nourished female in no acute distress.  Cardiovascular: normal rate Respiratory: normal effort GI: Abd soft, non-tender. Pos BS x 4 Neurologic: Alert and oriented x 4.  PELVIC EXAM: deferred   LAB RESULTS Results for orders placed or performed during the hospital encounter of 04/10/18 (from the past 24  hour(s))  CBC     Status: None   Collection Time: 04/10/18  6:28 PM  Result Value Ref Range   WBC 9.0 4.0 - 10.5 K/uL   RBC 4.63 3.87 - 5.11 MIL/uL   Hemoglobin 12.4 12.0 - 15.0 g/dL   HCT 16.137.4 09.636.0 - 04.546.0 %   MCV 80.8 78.0 - 100.0 fL   MCH 26.8 26.0 - 34.0 pg   MCHC 33.2 30.0 - 36.0 g/dL   RDW 40.913.5 81.111.5 - 91.415.5 %   Platelets 262 150 - 400 K/uL  hCG, quantitative, pregnancy     Status: Abnormal   Collection Time: 04/10/18  6:28 PM  Result Value Ref Range   hCG, Beta Chain, Quant, S 669 (H) <5 mIU/mL   IMAGING [place] Ob Less Than 14 Weeks With Ob Transvaginal  Result Date: 04/08/2018 CLINICAL DATA:  Positive urine pregnancy test.  Cramping. EXAM: OBSTETRIC <14 WK US AND TRANSVAGINAL OB US TECHNIQUE: Both transabdominal and transvaginal  ultrasound examinations were performed for complete evaluation of the gestation as well as the maternal uterus, adnexal regions, and pelvic cul-de-sac. Transvaginal technique was performed to assess early pregnancy. COMPARISON:  Ultrasound 08/15/2014 FINDINGS: Intrauterine gestational sac: In no intrauterine gestational sac identified Yolk sac:  Absent Embryo:  Absent Cardiac Activity: Absent Subchorionic hemorrhage:  None visualized. Maternal uterus/adnexae: Normal ovaries. Complex fluid collection in the posterior cul-de-sac. IMPRESSION: 1. No intrauterine gestation. 2. Complex fluid the posterior cul-de-sac 3. No intrauterine gestational sac, yolk sac, or fetal pole identified. Differential considerations include intrauterine pregnancy too early to be sonographically visualized, missed abortion, or ectopic pregnancy. Followup ultrasound is recommended in 10-14 days for further evaluation. Electronically Signed   By: Genevive BiStewart  Edmunds M.D.   On: 04/08/2018 20:59    MAU Management/MDM: Orders Placed This Encounter  Procedures  . CBC  . hCG, quantitative, pregnancy   HCG- has dropped from 868 on 4/17 to 669 today, most likely miscarriage  US on 4/17 did not show gestational sac or yolk sac (no IUP identified)   Discussed miscarriage  with patient. Educated on vaginal bleeding will most likely begin. Discussed reasons to return to MAU with vaginal bleeding that she is having to change pad more than once in an hour, lightheaded or dizziness. Patient verbalizes understanding. Answered patient's questions.   Consult Dr Charlotta Newtonzan. Confirms miscarriage with no IUP on US. Discussed precautions and reasons to return to MAU. Recommends patient call on Monday to be seen in the office next week.    Pt discharged with strict miscarriage precautions. Pt stable at time of discharge.   ASSESSMENT 1. Miscarriage     PLAN Discharge home Follow up in the office next week  Return to MAU as needed for vaginal  bleeding that she is having to change pad more than once in an hour, lightheaded,  dizziness or uncontrolled abdominal pain.    Allergies as of 04/10/2018      Reactions   Lavender Oil Hives   Sulfa Antibiotics Hives, Swelling      Medication List    TAKE these medications   BIOTIN PO Take 2 each by mouth daily. Gummy Biotin   prenatal multivitamin Tabs tablet Take 1 tablet by mouth daily at 12 noon.       Steward DroneVeronica Padme Arriaga  Certified Nurse-Midwife 04/10/2018  8:24 PM

## 2018-04-10 NOTE — MAU Note (Signed)
Pt here for follow up hcg. No increased pain or bleeding 

## 2018-09-26 LAB — OB RESULTS CONSOLE GC/CHLAMYDIA
Chlamydia: NEGATIVE
Gonorrhea: NEGATIVE

## 2018-09-26 LAB — OB RESULTS CONSOLE ANTIBODY SCREEN: Antibody Screen: NEGATIVE

## 2018-09-26 LAB — OB RESULTS CONSOLE RPR: RPR: NONREACTIVE

## 2018-09-26 LAB — OB RESULTS CONSOLE RUBELLA ANTIBODY, IGM: Rubella: IMMUNE

## 2018-09-26 LAB — OB RESULTS CONSOLE ABO/RH: RH Type: POSITIVE

## 2018-09-26 LAB — OB RESULTS CONSOLE HEPATITIS B SURFACE ANTIGEN: Hepatitis B Surface Ag: NEGATIVE

## 2018-09-26 LAB — OB RESULTS CONSOLE HIV ANTIBODY (ROUTINE TESTING): HIV: NONREACTIVE

## 2018-12-23 NOTE — L&D Delivery Note (Signed)
Delivery Note At 1:51 PM a viable female was delivered via Vaginal, Spontaneous (Presentation:Occiput anterior  ;  ). Nuchal cord x 1 reduced  APGAR: 8, 9; weight 6 lb 5.9 oz (2890 g).   Placenta status: complete with 3 vessel , .  Cord:  with the following complications:none .  Cord pH: NA  Anesthesia:  none Episiotomy: None Lacerations: None Suture Repair: NA Est. Blood Loss (mL): 300  Mom to postpartum.  Baby to Couplet care / Skin to Skin.  Christophe Louis 06/01/2019, 3:43 PM

## 2019-02-22 ENCOUNTER — Encounter (HOSPITAL_COMMUNITY): Payer: Self-pay

## 2019-04-27 LAB — OB RESULTS CONSOLE GBS: GBS: NEGATIVE

## 2019-05-19 ENCOUNTER — Telehealth (HOSPITAL_COMMUNITY): Payer: Self-pay | Admitting: *Deleted

## 2019-05-19 ENCOUNTER — Encounter (HOSPITAL_COMMUNITY): Payer: Self-pay | Admitting: *Deleted

## 2019-05-19 NOTE — Telephone Encounter (Signed)
Preadmission screen  

## 2019-05-20 ENCOUNTER — Encounter (HOSPITAL_COMMUNITY): Payer: Self-pay | Admitting: *Deleted

## 2019-05-28 ENCOUNTER — Other Ambulatory Visit (HOSPITAL_COMMUNITY)
Admission: RE | Admit: 2019-05-28 | Discharge: 2019-05-28 | Disposition: A | Discharge status: Home / Self Care | Payer: Medicaid Other | Source: Ambulatory Visit | Attending: Obstetrics and Gynecology | Admitting: Obstetrics and Gynecology

## 2019-05-28 ENCOUNTER — Other Ambulatory Visit: Payer: Self-pay

## 2019-05-28 DIAGNOSIS — Z1159 Encounter for screening for other viral diseases: Secondary | ICD-10-CM | POA: Insufficient documentation

## 2019-05-28 DIAGNOSIS — Z01812 Encounter for preprocedural laboratory examination: Secondary | ICD-10-CM | POA: Insufficient documentation

## 2019-05-28 NOTE — MAU Note (Signed)
Asymptomatic, swab collected without problem. 

## 2019-05-29 LAB — NOVEL CORONAVIRUS, NAA (HOSP ORDER, SEND-OUT TO REF LAB; TAT 18-24 HRS): SARS-CoV-2, NAA: NOT DETECTED

## 2019-05-30 ENCOUNTER — Other Ambulatory Visit: Payer: Self-pay | Admitting: Obstetrics and Gynecology

## 2019-05-31 ENCOUNTER — Other Ambulatory Visit (HOSPITAL_COMMUNITY): Payer: Self-pay | Admitting: *Deleted

## 2019-06-01 ENCOUNTER — Inpatient Hospital Stay (HOSPITAL_COMMUNITY): Payer: Medicaid Other

## 2019-06-01 ENCOUNTER — Inpatient Hospital Stay (HOSPITAL_COMMUNITY)
Admission: AD | Admit: 2019-06-01 | Discharge: 2019-06-02 | DRG: 807 | Disposition: A | Discharge status: Home / Self Care | Payer: Medicaid Other | Attending: Obstetrics and Gynecology | Admitting: Obstetrics and Gynecology

## 2019-06-01 ENCOUNTER — Other Ambulatory Visit: Payer: Self-pay

## 2019-06-01 ENCOUNTER — Encounter (HOSPITAL_COMMUNITY): Payer: Self-pay

## 2019-06-01 DIAGNOSIS — O480 Post-term pregnancy: Principal | ICD-10-CM | POA: Diagnosis present

## 2019-06-01 DIAGNOSIS — Z3A41 41 weeks gestation of pregnancy: Secondary | ICD-10-CM | POA: Diagnosis not present

## 2019-06-01 LAB — CBC
HCT: 35.1 % — ABNORMAL LOW (ref 36.0–46.0)
Hemoglobin: 11 g/dL — ABNORMAL LOW (ref 12.0–15.0)
MCH: 23.5 pg — ABNORMAL LOW (ref 26.0–34.0)
MCHC: 31.3 g/dL (ref 30.0–36.0)
MCV: 75 fL — ABNORMAL LOW (ref 80.0–100.0)
Platelets: 285 10*3/uL (ref 150–400)
RBC: 4.68 MIL/uL (ref 3.87–5.11)
RDW: 15.6 % — ABNORMAL HIGH (ref 11.5–15.5)
WBC: 11.3 10*3/uL — ABNORMAL HIGH (ref 4.0–10.5)
nRBC: 0 % (ref 0.0–0.2)

## 2019-06-01 LAB — TYPE AND SCREEN
ABO/RH(D): AB POS
Antibody Screen: NEGATIVE

## 2019-06-01 LAB — RPR: RPR Ser Ql: NONREACTIVE

## 2019-06-01 MED ORDER — TERBUTALINE SULFATE 1 MG/ML IJ SOLN: 0.2500 mg | Freq: Once | INTRAMUSCULAR | Status: DC | PRN

## 2019-06-01 MED ORDER — DIBUCAINE (PERIANAL) 1 % EX OINT: 1.0000 "application " | TOPICAL_OINTMENT | CUTANEOUS | Status: DC | PRN

## 2019-06-01 MED ORDER — LACTATED RINGERS IV SOLN
INTRAVENOUS | Status: DC
  Administered 2019-06-01: 125 mL/h via INTRAVENOUS

## 2019-06-01 MED ORDER — FERROUS SULFATE 325 (65 FE) MG PO TABS
325.0000 mg | ORAL_TABLET | Freq: Two times a day (BID) | ORAL | Status: DC
  Administered 2019-06-01 – 2019-06-02 (×2): 325 mg via ORAL
  Filled 2019-06-01 (×2): qty 1

## 2019-06-01 MED ORDER — PRENATAL MULTIVITAMIN CH
1.0000 | ORAL_TABLET | Freq: Every day | ORAL | Status: DC
  Administered 2019-06-02: 1 via ORAL
  Filled 2019-06-01: qty 1

## 2019-06-01 MED ORDER — OXYTOCIN 40 UNITS IN NORMAL SALINE INFUSION - SIMPLE MED
1.0000 m[IU]/min | INTRAVENOUS | Status: DC
  Administered 2019-06-01: 2 m[IU]/min via INTRAVENOUS
  Filled 2019-06-01: qty 1000

## 2019-06-01 MED ORDER — METHYLERGONOVINE MALEATE 0.2 MG PO TABS: 0.2000 mg | ORAL_TABLET | ORAL | Status: DC | PRN

## 2019-06-01 MED ORDER — COCONUT OIL OIL: 1.0000 "application " | TOPICAL_OIL | Status: DC | PRN

## 2019-06-01 MED ORDER — SIMETHICONE 80 MG PO CHEW: 80.0000 mg | CHEWABLE_TABLET | ORAL | Status: DC | PRN

## 2019-06-01 MED ORDER — BENZOCAINE-MENTHOL 20-0.5 % EX AERO: 1.0000 "application " | INHALATION_SPRAY | CUTANEOUS | Status: DC | PRN

## 2019-06-01 MED ORDER — FENTANYL CITRATE (PF) 100 MCG/2ML IJ SOLN: 50.0000 ug | INTRAMUSCULAR | Status: DC | PRN

## 2019-06-01 MED ORDER — OXYTOCIN 40 UNITS IN NORMAL SALINE INFUSION - SIMPLE MED: 2.5000 [IU]/h | INTRAVENOUS | Status: DC

## 2019-06-01 MED ORDER — SENNOSIDES-DOCUSATE SODIUM 8.6-50 MG PO TABS
2.0000 | ORAL_TABLET | ORAL | Status: DC
  Administered 2019-06-01: 2 via ORAL
  Filled 2019-06-01: qty 2

## 2019-06-01 MED ORDER — ZOLPIDEM TARTRATE 5 MG PO TABS: 5.0000 mg | ORAL_TABLET | Freq: Every evening | ORAL | Status: DC | PRN

## 2019-06-01 MED ORDER — IBUPROFEN 600 MG PO TABS
600.0000 mg | ORAL_TABLET | Freq: Four times a day (QID) | ORAL | Status: DC
  Administered 2019-06-01 – 2019-06-02 (×4): 600 mg via ORAL
  Filled 2019-06-01 (×4): qty 1

## 2019-06-01 MED ORDER — ACETAMINOPHEN 325 MG PO TABS: 650.0000 mg | ORAL_TABLET | ORAL | Status: DC | PRN

## 2019-06-01 MED ORDER — WITCH HAZEL-GLYCERIN EX PADS: 1.0000 "application " | MEDICATED_PAD | CUTANEOUS | Status: DC | PRN

## 2019-06-01 MED ORDER — OXYCODONE-ACETAMINOPHEN 5-325 MG PO TABS: 2.0000 | ORAL_TABLET | ORAL | Status: DC | PRN

## 2019-06-01 MED ORDER — OXYTOCIN BOLUS FROM INFUSION
500.0000 mL | Freq: Once | INTRAVENOUS | Status: AC
  Administered 2019-06-01: 500 mL via INTRAVENOUS

## 2019-06-01 MED ORDER — ONDANSETRON HCL 4 MG PO TABS: 4.0000 mg | ORAL_TABLET | ORAL | Status: DC | PRN

## 2019-06-01 MED ORDER — OXYCODONE-ACETAMINOPHEN 5-325 MG PO TABS: 1.0000 | ORAL_TABLET | ORAL | Status: DC | PRN

## 2019-06-01 MED ORDER — LACTATED RINGERS IV SOLN: 500.0000 mL | INTRAVENOUS | Status: DC | PRN

## 2019-06-01 MED ORDER — SOD CITRATE-CITRIC ACID 500-334 MG/5ML PO SOLN: 30.0000 mL | ORAL | Status: DC | PRN

## 2019-06-01 MED ORDER — LIDOCAINE HCL (PF) 1 % IJ SOLN: 30.0000 mL | INTRAMUSCULAR | Status: DC | PRN

## 2019-06-01 MED ORDER — DIPHENHYDRAMINE HCL 25 MG PO CAPS: 25.0000 mg | ORAL_CAPSULE | Freq: Four times a day (QID) | ORAL | Status: DC | PRN

## 2019-06-01 MED ORDER — ONDANSETRON HCL 4 MG/2ML IJ SOLN: 4.0000 mg | Freq: Four times a day (QID) | INTRAMUSCULAR | Status: DC | PRN

## 2019-06-01 MED ORDER — HYDROXYZINE HCL 50 MG PO TABS: 50.0000 mg | ORAL_TABLET | Freq: Four times a day (QID) | ORAL | Status: DC | PRN

## 2019-06-01 MED ORDER — ONDANSETRON HCL 4 MG/2ML IJ SOLN: 4.0000 mg | INTRAMUSCULAR | Status: DC | PRN

## 2019-06-01 MED ORDER — METHYLERGONOVINE MALEATE 0.2 MG/ML IJ SOLN: 0.2000 mg | INTRAMUSCULAR | Status: DC | PRN

## 2019-06-01 NOTE — Progress Notes (Signed)
  Subjective: Pt more uncomfortable with contractions no lof no vaginal bleeding. +FM.   Objective: BP (!) 119/58 (BP Location: Left Arm)   Pulse 67   Temp 97.7 F (36.5 C) (Oral)   Resp 18   Wt 62.3 kg   BMI 27.73 kg/m  No intake/output data recorded. No intake/output data recorded.  FHT:  FHR: 130 bpm, variability: moderate,  accelerations:  Present,  decelerations:  Absent UC:   regular, every 2 minutes SVE:   Dilation: 7 Effacement (%): 100 Station: -1 Exam by:: MD Levie Heritage clear fluid   Labs: Lab Results  Component Value Date   WBC 11.3 (H) 06/01/2019   HGB 11.0 (L) 06/01/2019   HCT 35.1 (L) 06/01/2019   MCV 75.0 (L) 06/01/2019   PLT 285 06/01/2019    Assessment / Plan: Induction of labor due to postterm,  progressing well on pitocin  Labor: Progressing normally Preeclampsia:  NA Fetal Wellbeing:  Category I Pain Control:  Labor support without medications I/D:  n/a Anticipated MOD:  NSVD  Christophe Louis 06/01/2019, 1:10 PM

## 2019-06-01 NOTE — H&P (Signed)
Danielle Carr is a 33 y.o. female G6 P2032 at 41 wks and 0 days  presenting for induction of labor due to post dates. Pregnancy has been uncomplicated. Prenatal care provided by Dr. Christophe Louis with [facility]. OB History    Gravida  6   Para  2   Term  2   Preterm      AB  3   Living  2     SAB  3   TAB      Ectopic      Multiple  0   Live Births  2          History reviewed. No pertinent past medical history. Past Surgical History:  Procedure Laterality Date  . NO PAST SURGERIES    PMH none Family History: family history includes Diabetes in her paternal grandmother; Thyroid disease in her maternal aunt. Social History:  reports that she has never smoked. She has never used smokeless tobacco. She reports that she does not drink alcohol or use drugs.     Maternal Diabetes: No Genetic Screening: Declined Maternal Ultrasounds/Referrals: Normal Fetal Ultrasounds or other Referrals:  None Maternal Substance Abuse:  No Significant Maternal Medications:  None Significant Maternal Lab Results:  Lab values include: Group B Strep negative Other Comments:  None  Review of Systems  Constitutional: Negative.   HENT: Negative.   Eyes: Negative.   Respiratory: Negative.   Cardiovascular: Negative.   Gastrointestinal: Negative.   Genitourinary: Negative.   Musculoskeletal: Negative.   Skin: Negative.   Neurological: Negative.   Endo/Heme/Allergies: Negative.   Psychiatric/Behavioral: Negative.    History Dilation: 7 Effacement (%): 100 Station: -1 Exam by:: MD Landry Mellow Blood pressure (!) 119/58, pulse 67, temperature 97.7 F (36.5 C), temperature source Oral, resp. rate 18, weight 62.3 kg, unknown if currently breastfeeding. Maternal Exam:  Abdomen: Patient reports no abdominal tenderness. Fetal presentation: vertex  Introitus: Normal vulva. Normal vagina.  Pelvis: adequate for delivery.      Fetal Exam Fetal Monitor Review: Baseline rate: 125.   Variability: moderate (6-25 bpm).   Pattern: accelerations present and no decelerations.    Fetal State Assessment: Category I - tracings are normal.     Physical Exam  Vitals reviewed. Constitutional: She is oriented to person, place, and time. She appears well-developed and well-nourished.  HENT:  Head: Normocephalic and atraumatic.  Eyes: Pupils are equal, round, and reactive to light. Conjunctivae are normal.  Neck: Normal range of motion. Neck supple.  Cardiovascular: Normal rate and regular rhythm.  Respiratory: Effort normal and breath sounds normal.  GI: There is no abdominal tenderness.  Genitourinary:    Vulva and vagina normal.   Musculoskeletal: Normal range of motion.        General: No edema.  Neurological: She is alert and oriented to person, place, and time.  Skin: Skin is warm and dry.  Psychiatric: She has a normal mood and affect.    Prenatal labs: ABO, Rh: --/--/AB POS, AB POS Performed at [facility], [place] [address] [place], [place] [address]  [phone]/09 0720) Antibody: NEG (06/09 0720) Rubella: Immune (10/05 0000) RPR: Nonreactive (10/05 0000)  HBsAg: Negative (10/05 0000)  HIV: Non-reactive (10/05 0000)  GBS: Negative (05/05 0000)   Assessment/Plan: 41 wks and 0 days for induction due to post dates  Pitocin for induction AROM with fetal descent Anticipate SVD.   Christophe Louis 06/01/2019, 1:06 PM

## 2019-06-01 NOTE — Lactation Note (Signed)
This note was copied from a baby's chart. Lactation Consultation Note  Patient Name: Danielle Carr Today's Date: 06/01/2019    [facility] Initial Visit:  Mother was getting ready to give baby a bottle when I arrived.  Mother politely informed me that her feeding preference is formula.  She will not be needing lactation services.                    Denaya Horn R Alexandro Line 06/01/2019, 4:41 PM

## 2019-06-02 LAB — CBC
HCT: 31.4 % — ABNORMAL LOW (ref 36.0–46.0)
Hemoglobin: 9.7 g/dL — ABNORMAL LOW (ref 12.0–15.0)
MCH: 23.3 pg — ABNORMAL LOW (ref 26.0–34.0)
MCHC: 30.9 g/dL (ref 30.0–36.0)
MCV: 75.5 fL — ABNORMAL LOW (ref 80.0–100.0)
Platelets: 254 10*3/uL (ref 150–400)
RBC: 4.16 MIL/uL (ref 3.87–5.11)
RDW: 15.9 % — ABNORMAL HIGH (ref 11.5–15.5)
WBC: 11.5 10*3/uL — ABNORMAL HIGH (ref 4.0–10.5)
nRBC: 0 % (ref 0.0–0.2)

## 2019-06-02 LAB — ABO/RH: ABO/RH(D): AB POS

## 2019-06-02 MED ORDER — IBUPROFEN 600 MG PO TABS: 600.0000 mg | ORAL_TABLET | Freq: Four times a day (QID) | ORAL | 0 refills | Status: DC | PRN

## 2019-06-02 NOTE — Discharge Summary (Signed)
OB Discharge Summary     Patient Name: Danielle Carr DOB: 1986-10-12 MRN: [id number]  Date of admission: 06/01/2019 Delivering MD: Christophe Louis   Date of discharge: 06/02/2019  Admitting diagnosis: pregnancy Intrauterine pregnancy: [redacted]w[redacted]d     Secondary diagnosis:  Active Problems:   Post-dates pregnancy  Additional problems: None     Discharge diagnosis: Term Pregnancy Delivered                                                                                                Post partum procedures:None  Augmentation: AROM and Pitocin  Complications: None  Hospital course:  Induction of Labor With Vaginal Delivery   33 y.o. yo [phone] at [redacted]w[redacted]d was admitted to the hospital 06/01/2019 for induction of labor.  Indication for induction: Postdates.  Patient had an uncomplicated labor course as follows: Membrane Rupture Time/Date: 12:54 PM ,06/01/2019   Intrapartum Procedures: Episiotomy: None [1]                                         Lacerations:  None [1]  Patient had delivery of a Viable infant.  Information for the patient's newborn:  Gabbard, Girl Mandolin [[id number]]  Delivery Method: Vaginal, Spontaneous(Filed from Delivery Summary)   06/01/2019  Details of delivery can be found in separate delivery note.  Patient had a routine postpartum course. Patient is discharged home 06/02/19.  Physical exam  Vitals:   06/01/19 1645 06/01/19 2028 06/01/19 2319 06/02/19 0508  BP: (!) 96/50 108/67 (!) 100/52 (!) 105/54  Pulse: 80 84 63 70  Resp: 18 17 17 18   Temp: 98.7 F (37.1 C) 99 F (37.2 C) 98.9 F (37.2 C) 98.8 F (37.1 C)  TempSrc: Oral Oral Oral Oral  SpO2:  99%  99%  Weight:       General: alert, cooperative and no distress Lochia: appropriate Uterine Fundus: firm Incision: N/A DVT Evaluation: No evidence of DVT seen on physical exam. Labs: Lab Results  Component Value Date   WBC 11.5 (H) 06/02/2019   HGB 9.7 (L) 06/02/2019   HCT 31.4 (L) 06/02/2019   MCV 75.5 (L)  06/02/2019   PLT 254 06/02/2019   No flowsheet data found.  Discharge instruction: per After Visit Summary and "Baby and Me Booklet".  After visit meds:  Allergies as of 06/02/2019      Reactions   Lavender Oil Hives   Sulfa Antibiotics Hives, Swelling      Medication List    STOP taking these medications   prenatal multivitamin Tabs tablet     TAKE these medications   BIOTIN PO Take 2 each by mouth daily. Gummy Biotin   ibuprofen 600 MG tablet Commonly known as:  ADVIL Take 1 tablet (600 mg total) by mouth every 6 (six) hours as needed.       Diet: routine diet  Activity: Advance as tolerated. Pelvic rest for 6 weeks.   Outpatient follow up:6 weeks Follow up Appt:No future appointments. Follow up Visit:No follow-ups  on file.  Postpartum contraception: Not Discussed  Newborn Data: Live born female  Birth Weight: 6 lb 5.9 oz (2890 g) APGAR: 8, 9  Newborn Delivery   Birth date/time:  06/01/2019 13:51:00 Delivery type:  Vaginal, Spontaneous     Baby Feeding: Bottle Disposition:home with mother   06/02/2019 Gerald Leitzara Jeremie Abdelaziz, MD

## 2020-01-01 IMAGING — US US OB < 14 WEEKS - US OB TV
1 series · 15 of 28 positions shown · non-contrast
Comparison: Ultrasound 08/15/2014

CLINICAL DATA: Positive urine pregnancy test.  Cramping.

EXAM:
OBSTETRIC <14 WK US AND TRANSVAGINAL OB US
TECHNIQUE: Both transabdominal and transvaginal ultrasound examinations were
performed for complete evaluation of the gestation as well as the
maternal uterus, adnexal regions, and pelvic cul-de-sac.
Transvaginal technique was performed to assess early pregnancy.

[Series 1: us ob < 14 weeks - us ob tv · 15 of 53 slices shown]
[im 1/53]
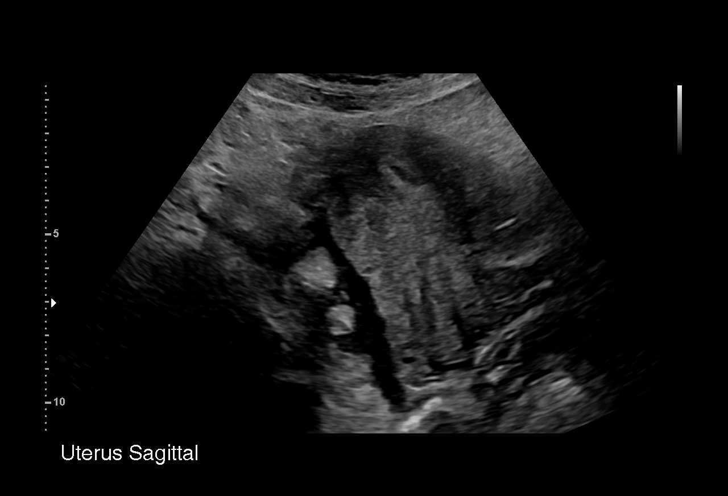
[im 4/53]
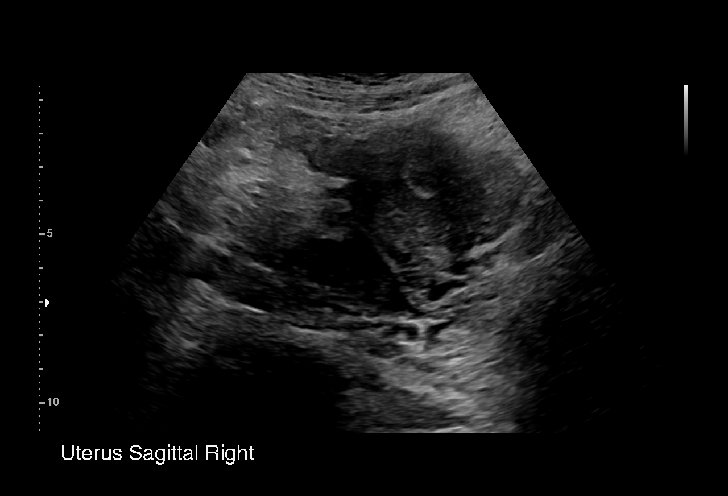
[im 8/53]
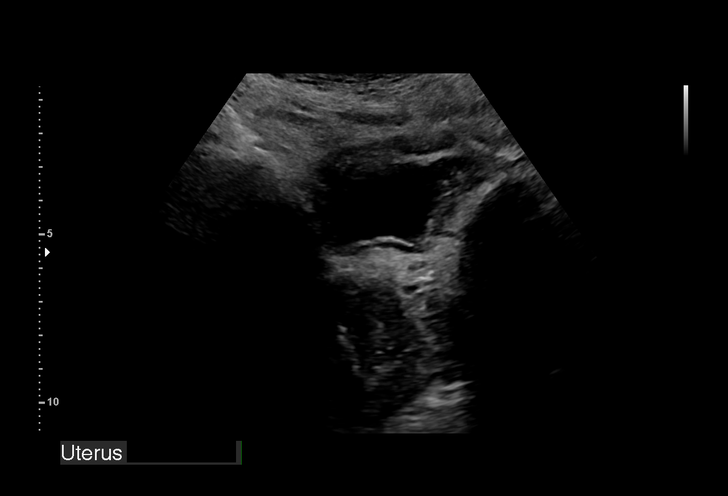
[im 12/53]
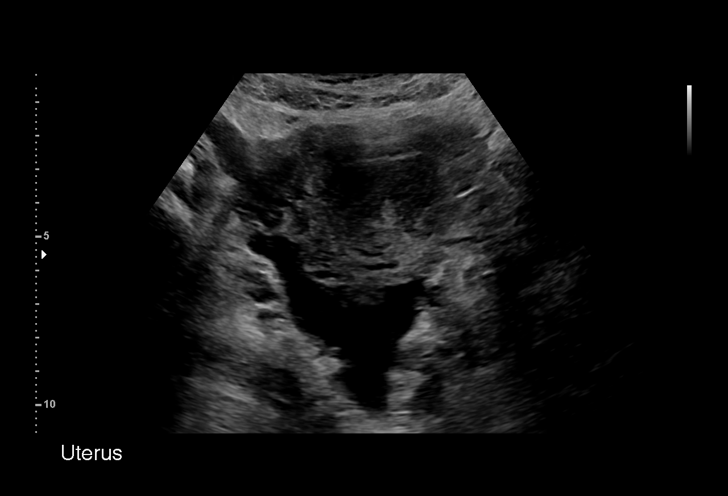
[im 16/53]
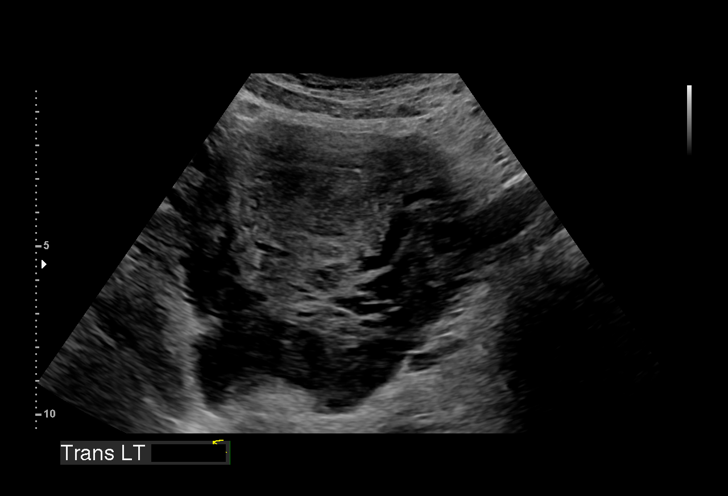
[im 20/53]
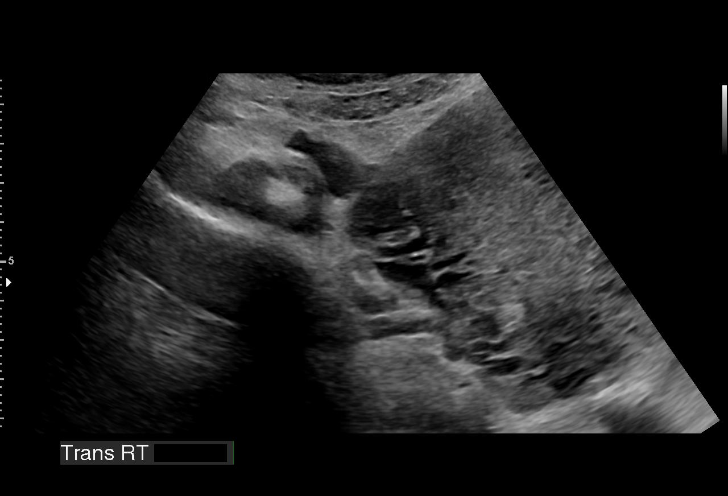
[im 24/53]
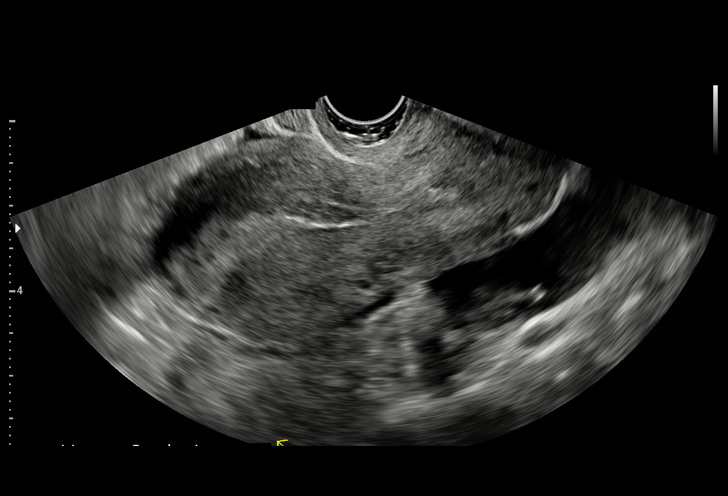
[im 27/53]
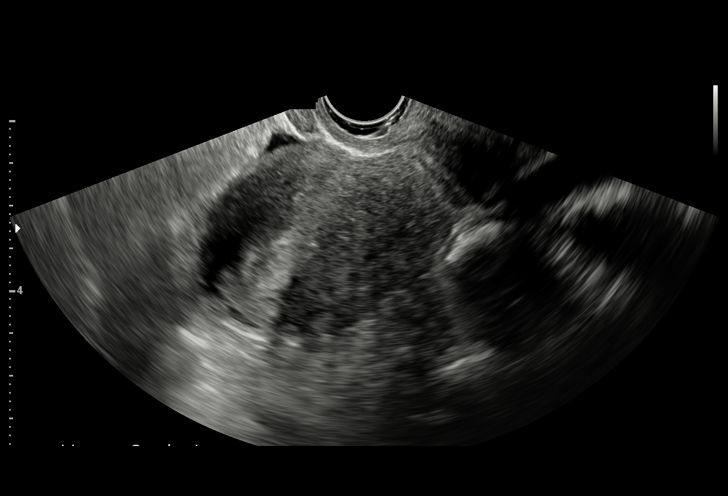
[im 29/53]
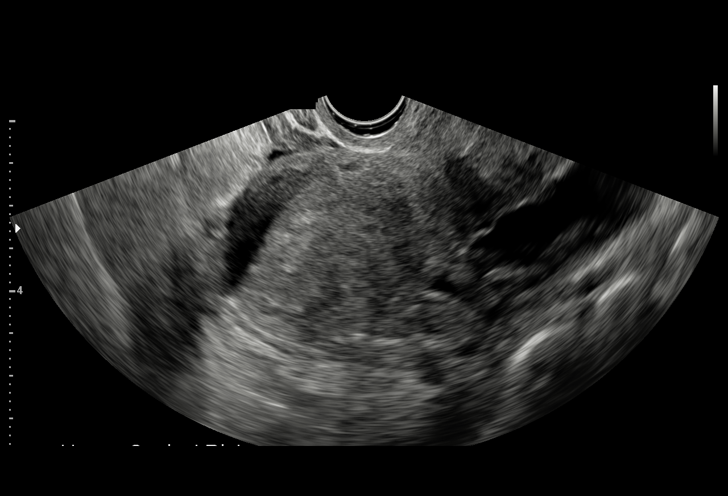
[im 33/53]
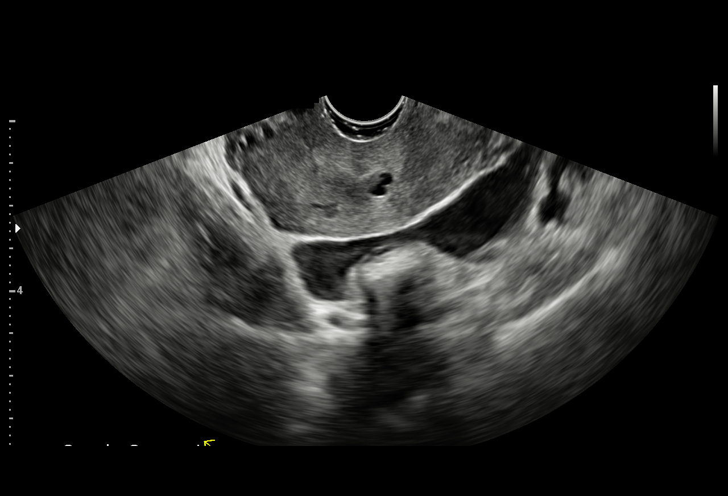
[im 37/53]
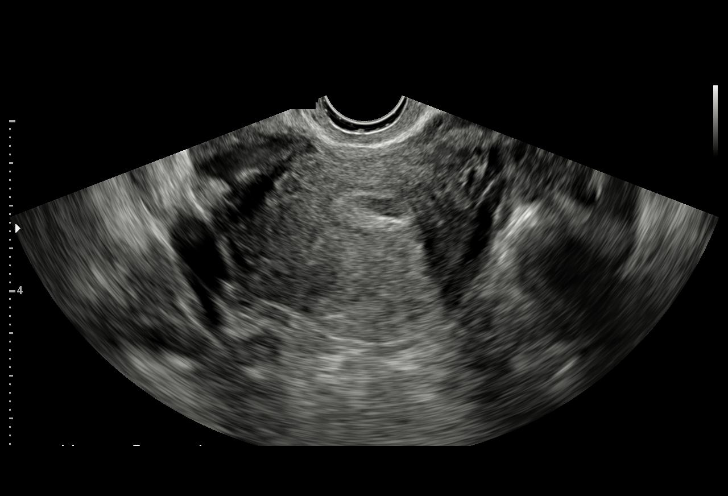
[im 41/53]
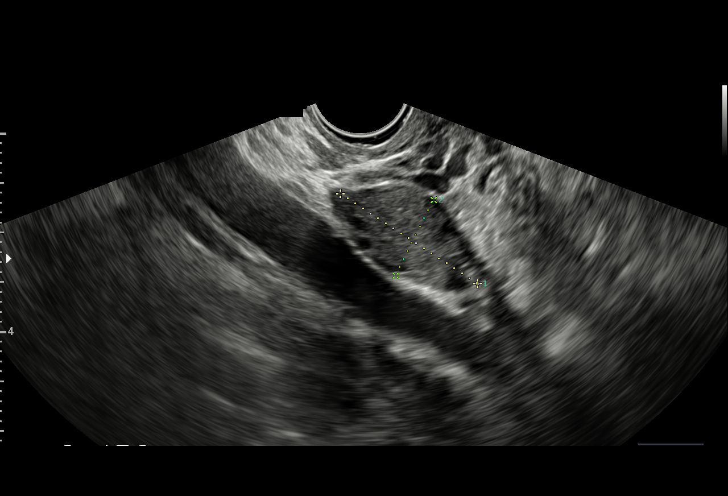
[im 45/53]
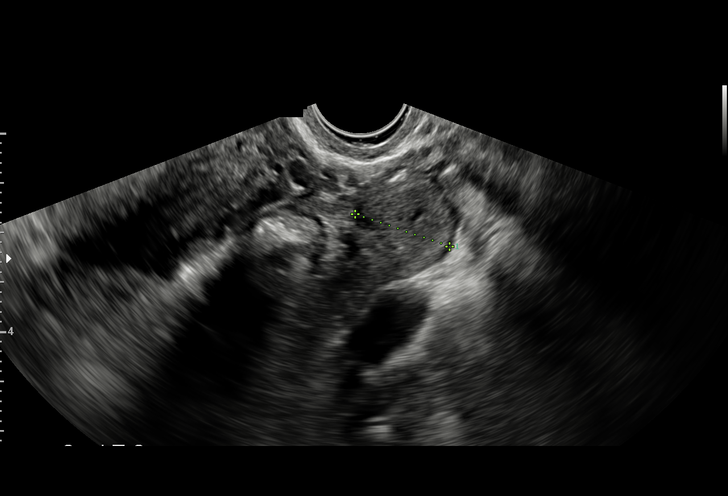
[im 49/53]
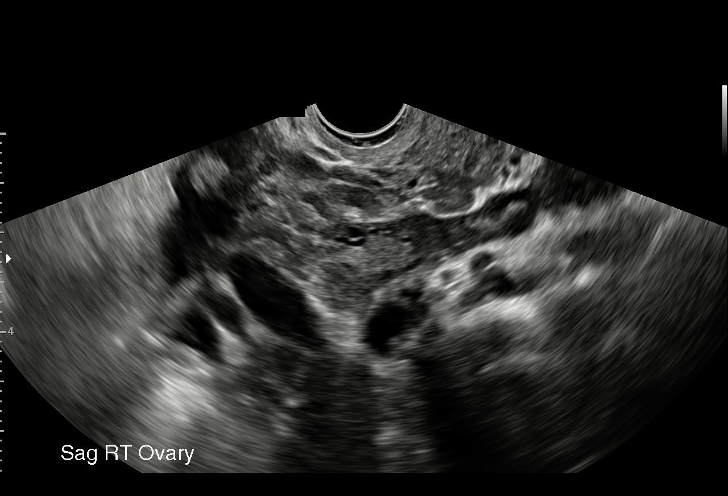
[im 53/53]
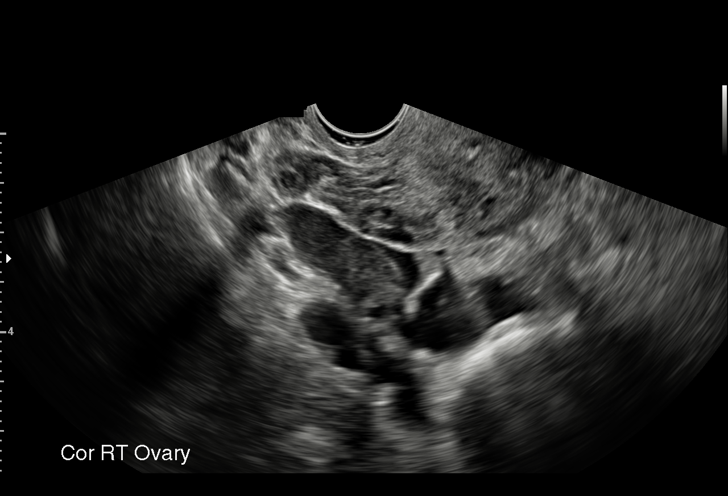

[15 of 28 positions shown; findings below may reference images not displayed]

FINDINGS: Intrauterine gestational sac: In no intrauterine gestational sac
identified

Yolk sac:  Absent

Embryo:  Absent

Cardiac Activity: Absent

Subchorionic hemorrhage:  None visualized.

Maternal uterus/adnexae: Normal ovaries. Complex fluid collection in
the posterior cul-de-sac.
IMPRESSION: 1. No intrauterine gestation.
2. Complex fluid the posterior cul-de-sac
3. No intrauterine gestational sac, yolk sac, or fetal pole
identified. Differential considerations include intrauterine
pregnancy too early to be sonographically visualized, missed
abortion, or ectopic pregnancy. Followup ultrasound is recommended
in 10-14 days for further evaluation.

## 2021-09-20 LAB — OB RESULTS CONSOLE RUBELLA ANTIBODY, IGM: Rubella: IMMUNE

## 2021-09-20 LAB — HEPATITIS C ANTIBODY: HCV Ab: NEGATIVE

## 2021-09-20 LAB — OB RESULTS CONSOLE RPR: RPR: NONREACTIVE

## 2021-09-20 LAB — OB RESULTS CONSOLE HIV ANTIBODY (ROUTINE TESTING): HIV: NONREACTIVE

## 2021-09-20 LAB — OB RESULTS CONSOLE HEPATITIS B SURFACE ANTIGEN: Hepatitis B Surface Ag: NEGATIVE

## 2021-10-04 LAB — OB RESULTS CONSOLE GC/CHLAMYDIA
Chlamydia: NEGATIVE
Gonorrhea: NEGATIVE

## 2021-12-23 NOTE — L&D Delivery Note (Signed)
Delivery Note ?At 11:44 PM a viable female was delivered via Vaginal, Spontaneous (Presentation: Right Occiput Posterior).  APGAR 9 at 1 min : 9 at 5 min , ; weight  3100 grams .   ?Placenta status: Spontaneous, Intact.  Cord: 3 vessels with the following complications: None.  Cord pH: NA ? ?Anesthesia:IV fentanyl / Nitrous oxide  ?Episiotomy:  None ?Lacerations:  None ?Suture Repair:  NA ?Est. Blood Loss (mL):  480  mL uterine atony controlled with pitocin and TXA given as well  ? ?Mom to postpartum.  Baby to Couplet care / Skin to Skin. ? ?Gerald Leitz ?05/02/2022, 11:59 PM ? ? ? ?

## 2022-04-08 LAB — OB RESULTS CONSOLE GBS: GBS: NEGATIVE

## 2022-05-01 ENCOUNTER — Telehealth (HOSPITAL_COMMUNITY): Payer: Self-pay | Admitting: *Deleted

## 2022-05-01 ENCOUNTER — Other Ambulatory Visit: Payer: Self-pay | Admitting: Obstetrics and Gynecology

## 2022-05-01 ENCOUNTER — Encounter (HOSPITAL_COMMUNITY): Payer: Self-pay | Admitting: *Deleted

## 2022-05-01 DIAGNOSIS — Z3490 Encounter for supervision of normal pregnancy, unspecified, unspecified trimester: Secondary | ICD-10-CM

## 2022-05-01 NOTE — Telephone Encounter (Signed)
Preadmission screen  

## 2022-05-02 ENCOUNTER — Encounter (HOSPITAL_COMMUNITY): Payer: Self-pay | Admitting: Obstetrics and Gynecology

## 2022-05-02 ENCOUNTER — Inpatient Hospital Stay (HOSPITAL_COMMUNITY): Payer: Medicaid Other

## 2022-05-02 ENCOUNTER — Inpatient Hospital Stay (HOSPITAL_COMMUNITY)
Admission: AD | Admit: 2022-05-02 | Discharge: 2022-05-04 | DRG: 807 | Disposition: A | Discharge status: Home / Self Care | Payer: Medicaid Other | Attending: Obstetrics and Gynecology | Admitting: Obstetrics and Gynecology

## 2022-05-02 DIAGNOSIS — Z3A39 39 weeks gestation of pregnancy: Secondary | ICD-10-CM | POA: Diagnosis not present

## 2022-05-02 DIAGNOSIS — O26893 Other specified pregnancy related conditions, third trimester: Secondary | ICD-10-CM | POA: Diagnosis present

## 2022-05-02 DIAGNOSIS — Z3490 Encounter for supervision of normal pregnancy, unspecified, unspecified trimester: Secondary | ICD-10-CM

## 2022-05-02 LAB — CBC
HCT: 38.5 % (ref 36.0–46.0)
Hemoglobin: 12.8 g/dL (ref 12.0–15.0)
MCH: 28.1 pg (ref 26.0–34.0)
MCHC: 33.2 g/dL (ref 30.0–36.0)
MCV: 84.4 fL (ref 80.0–100.0)
Platelets: 243 10*3/uL (ref 150–400)
RBC: 4.56 MIL/uL (ref 3.87–5.11)
RDW: 12.7 % (ref 11.5–15.5)
WBC: 10.1 10*3/uL (ref 4.0–10.5)
nRBC: 0 % (ref 0.0–0.2)

## 2022-05-02 LAB — TYPE AND SCREEN
ABO/RH(D): AB POS
Antibody Screen: NEGATIVE

## 2022-05-02 MED ORDER — FENTANYL CITRATE (PF) 100 MCG/2ML IJ SOLN
50.0000 ug | INTRAMUSCULAR | Status: DC | PRN
  Administered 2022-05-02: 50 ug via INTRAVENOUS
  Filled 2022-05-02 (×2): qty 2

## 2022-05-02 MED ORDER — LACTATED RINGERS IV SOLN: 500.0000 mL | INTRAVENOUS | Status: DC | PRN

## 2022-05-02 MED ORDER — ACETAMINOPHEN 325 MG PO TABS: 650.0000 mg | ORAL_TABLET | ORAL | Status: DC | PRN

## 2022-05-02 MED ORDER — ONDANSETRON HCL 4 MG/2ML IJ SOLN: 4.0000 mg | Freq: Four times a day (QID) | INTRAMUSCULAR | Status: DC | PRN

## 2022-05-02 MED ORDER — OXYTOCIN-SODIUM CHLORIDE 30-0.9 UT/500ML-% IV SOLN
1.0000 m[IU]/min | INTRAVENOUS | Status: DC
  Administered 2022-05-02: 2 m[IU]/min via INTRAVENOUS
  Filled 2022-05-02: qty 500

## 2022-05-02 MED ORDER — TERBUTALINE SULFATE 1 MG/ML IJ SOLN
0.2500 mg | Freq: Once | INTRAMUSCULAR | Status: DC | PRN
Start: 2022-05-02 — End: 2022-05-03

## 2022-05-02 MED ORDER — TRANEXAMIC ACID-NACL 1000-0.7 MG/100ML-% IV SOLN: 1000.0000 mg | INTRAVENOUS | Status: DC

## 2022-05-02 MED ORDER — OXYCODONE-ACETAMINOPHEN 5-325 MG PO TABS: 2.0000 | ORAL_TABLET | ORAL | Status: DC | PRN

## 2022-05-02 MED ORDER — OXYTOCIN-SODIUM CHLORIDE 30-0.9 UT/500ML-% IV SOLN
2.5000 [IU]/h | INTRAVENOUS | Status: DC
  Administered 2022-05-03: 2.5 [IU]/h via INTRAVENOUS

## 2022-05-02 MED ORDER — LACTATED RINGERS IV SOLN: INTRAVENOUS | Status: DC

## 2022-05-02 MED ORDER — TRANEXAMIC ACID-NACL 1000-0.7 MG/100ML-% IV SOLN
INTRAVENOUS | Status: AC
  Administered 2022-05-02: 1000 mg
  Filled 2022-05-02: qty 100

## 2022-05-02 MED ORDER — OXYCODONE-ACETAMINOPHEN 5-325 MG PO TABS: 1.0000 | ORAL_TABLET | ORAL | Status: DC | PRN

## 2022-05-02 MED ORDER — SOD CITRATE-CITRIC ACID 500-334 MG/5ML PO SOLN: 30.0000 mL | ORAL | Status: DC | PRN

## 2022-05-02 MED ORDER — LIDOCAINE HCL (PF) 1 % IJ SOLN: 30.0000 mL | INTRAMUSCULAR | Status: DC | PRN

## 2022-05-02 MED ORDER — OXYTOCIN BOLUS FROM INFUSION
333.0000 mL | Freq: Once | INTRAVENOUS | Status: AC
Start: 2022-05-02 — End: 2022-05-02
  Administered 2022-05-02: 333 mL via INTRAVENOUS

## 2022-05-02 NOTE — Progress Notes (Signed)
?  Subjective: ?Pt rates contractions from 2-7. No LOF no vaginal bleeding +FM ? ?Objective: ?BP 123/76   Pulse 96   Temp 97.7 ?F (36.5 ?C) (Oral)   Resp 16   Ht 4\' 11"  (1.499 m)   Wt 79.2 kg   LMP 07/31/2021   BMI 35.24 kg/m?  ?No intake/output data recorded. ?No intake/output data recorded. ? ?FHT:  FHR: 140 bpm, variability: moderate,  accelerations:  Present,  decelerations:  Absent ?UC:   regular, every 3-4 minutes ?SVE:   Dilation: 4 ?Effacement (%): 80 ?Station: -2 ?Exam by:: Dr. [email or web address] ?AROM performed Clear fluid  ?Labs: ?Lab Results  ?Component Value Date  ? WBC 10.1 05/02/2022  ? HGB 12.8 05/02/2022  ? HCT 38.5 05/02/2022  ? MCV 84.4 05/02/2022  ? PLT 243 05/02/2022  ? ? ?Assessment / Plan: ?Induction of labor  elective at term  ? ?Labor:  Continue pitocin  ?Preeclampsia:   NA ?Fetal Wellbeing:  Category I ?Pain Control:  Labor support without medications ?I/D:  n/a ?Anticipated MOD:  NSVD ? ?07/02/2022 ?05/02/2022, 9:47 PM ? ? ?

## 2022-05-02 NOTE — H&P (Signed)
Danielle Carr is a 36 y.o. female G7 P3033 at 39 weeks and 2 days presenting for elective induction at term due to favorable cervix. Pregnancy has been uncomplicated. Prenatal care provided by Dr. Gerald Leitz with [facility]. ?OB History   ? ? Gravida  ?7  ? Para  ?3  ? Term  ?3  ? Preterm  ?   ? AB  ?3  ? Living  ?3  ?  ? ? SAB  ?3  ? IAB  ?   ? Ectopic  ?   ? Multiple  ?0  ? Live Births  ?3  ?   ?  ?  ? ?History reviewed. No pertinent past medical history. ?Past Surgical History:  ?Procedure Laterality Date  ? NO PAST SURGERIES    ? ?Family History: family history includes Diabetes in her paternal grandmother; Thyroid disease in her maternal aunt. ?Social History:  reports that she has never smoked. She has never used smokeless tobacco. She reports that she does not drink alcohol and does not use drugs. ? ? ?  ?Maternal Diabetes: No ?Genetic Screening: Declined ?Maternal Ultrasounds/Referrals: Normal ?Fetal Ultrasounds or other Referrals:  None ?Maternal Substance Abuse:  No ?Significant Maternal Medications:  None ?Significant Maternal Lab Results:  Group B Strep negative ?Other Comments:  None ? ?Review of Systems  ?Constitutional: Negative.   ?HENT: Negative.    ?Eyes: Negative.   ?Respiratory: Negative.    ?Cardiovascular: Negative.   ?Gastrointestinal: Negative.   ?Endocrine: Negative.   ?Genitourinary: Negative.   ?Musculoskeletal: Negative.   ?Skin: Negative.   ?Allergic/Immunologic: Negative.   ?Neurological: Negative.   ?Hematological: Negative.   ?Psychiatric/Behavioral: Negative.    ?History ?Dilation: 4 ?Effacement (%): 80 ?Station: -3 ?Exam by:: Mary [place] Johnson, RNC-OB ?Blood pressure 116/69, pulse 77, height 4\' 11"  (1.499 m), weight 79.2 kg, last menstrual period 07/31/2021, unknown if currently breastfeeding. ?Maternal Exam:  ?Introitus: Normal vulva.  ?Physical Exam ?Vitals reviewed.  ?Constitutional:   ?   Appearance: Normal appearance.  ?HENT:  ?   Head: Normocephalic and atraumatic.  ?    Nose: Nose normal.  ?   Mouth/Throat:  ?   Mouth: Mucous membranes are moist.  ?Cardiovascular:  ?   Rate and Rhythm: Normal rate and regular rhythm.  ?   Pulses: Normal pulses.  ?   Heart sounds: Normal heart sounds.  ?Pulmonary:  ?   Effort: Pulmonary effort is normal.  ?   Breath sounds: Normal breath sounds.  ?Abdominal:  ?   Tenderness: There is no abdominal tenderness.  ?Genitourinary: ?   General: Normal vulva.  ?Musculoskeletal:     ?   General: Normal range of motion.  ?   Cervical back: Normal range of motion and neck supple.  ?Skin: ?   General: Skin is warm and dry.  ?Neurological:  ?   General: No focal deficit present.  ?   Mental Status: She is alert and oriented to person, place, and time.  ?Psychiatric:     ?   Mood and Affect: Mood normal.     ?   Behavior: Behavior normal.  ?  ?Prenatal labs: ?ABO, Rh: --/--/AB POS (05/11 1658) ?Antibody: NEG (05/11 1658) ?Rubella: Immune (09/29 0000) ?RPR: Nonreactive (09/29 0000)  ?HBsAg: Negative (09/29 0000)  ?HIV: Non-reactive (09/29 0000)  ?GBS: Negative/-- (04/17 0000)  ? ?Assessment/Plan: ?39 weeks and 2 days for elective induction due to favorable cervix at term ?Pitocin followed by AROM with fetal descent ?Anticipate SVD  ? ?  Gerald Leitz ?05/02/2022, 9:10 PM ? ? ? ? ?

## 2022-05-02 NOTE — Progress Notes (Signed)
? ?  Subjective: ?Patient rates her contractions as 8 out 10.Marland Kitchen Continued lof no vaginal bleeding. +FM  ? ?Objective: ?BP 123/76   Pulse 96   Temp 97.7 ?F (36.5 ?C) (Oral)   Resp 16   Ht 4\' 11"  (1.499 m)   Wt 79.2 kg   LMP 07/31/2021   BMI 35.24 kg/m?  ?No intake/output data recorded. ?No intake/output data recorded. ? ?FHT:  FHR: 140 bpm, variability: moderate,  accelerations:  Present,  decelerations:  Absent ?UC:   regular, every 2 minutes ?SVE:   Dilation: 8 ?Effacement (%): 90 ?Station: 0 ?Exam by:: dr. [email or web address] ? ?Labs: ?Lab Results  ?Component Value Date  ? WBC 10.1 05/02/2022  ? HGB 12.8 05/02/2022  ? HCT 38.5 05/02/2022  ? MCV 84.4 05/02/2022  ? PLT 243 05/02/2022  ? ? ?Assessment / Plan: ?Induction of labor due to term with favorable cervix,  progressing well on pitocin ? ?Labor:  Progressing on Pitocin ?Preeclampsia:   NA ?Fetal Wellbeing:  Category I ?Pain Control:  Nitrous Oxide ?I/D:  n/a ?Anticipated MOD:  NSVD ? ?07/02/2022 ?05/02/2022, 11:00 PM ? ? ?

## 2022-05-03 ENCOUNTER — Encounter (HOSPITAL_COMMUNITY): Payer: Self-pay | Admitting: Obstetrics and Gynecology

## 2022-05-03 LAB — CBC
HCT: 35 % — ABNORMAL LOW (ref 36.0–46.0)
Hemoglobin: 11.7 g/dL — ABNORMAL LOW (ref 12.0–15.0)
MCH: 27.9 pg (ref 26.0–34.0)
MCHC: 33.4 g/dL (ref 30.0–36.0)
MCV: 83.5 fL (ref 80.0–100.0)
Platelets: 232 10*3/uL (ref 150–400)
RBC: 4.19 MIL/uL (ref 3.87–5.11)
RDW: 12.9 % (ref 11.5–15.5)
WBC: 17.7 10*3/uL — ABNORMAL HIGH (ref 4.0–10.5)
nRBC: 0 % (ref 0.0–0.2)

## 2022-05-03 LAB — RPR: RPR Ser Ql: NONREACTIVE

## 2022-05-03 MED ORDER — ONDANSETRON HCL 4 MG/2ML IJ SOLN: 4.0000 mg | INTRAMUSCULAR | Status: DC | PRN

## 2022-05-03 MED ORDER — SIMETHICONE 80 MG PO CHEW: 80.0000 mg | CHEWABLE_TABLET | ORAL | Status: DC | PRN

## 2022-05-03 MED ORDER — DIPHENHYDRAMINE HCL 25 MG PO CAPS: 25.0000 mg | ORAL_CAPSULE | Freq: Four times a day (QID) | ORAL | Status: DC | PRN

## 2022-05-03 MED ORDER — WITCH HAZEL-GLYCERIN EX PADS: 1.0000 "application " | MEDICATED_PAD | CUTANEOUS | Status: DC | PRN

## 2022-05-03 MED ORDER — ZOLPIDEM TARTRATE 5 MG PO TABS: 5.0000 mg | ORAL_TABLET | Freq: Every evening | ORAL | Status: DC | PRN

## 2022-05-03 MED ORDER — ACETAMINOPHEN 325 MG PO TABS: 650.0000 mg | ORAL_TABLET | ORAL | Status: DC | PRN

## 2022-05-03 MED ORDER — IBUPROFEN 600 MG PO TABS: 600.0000 mg | ORAL_TABLET | Freq: Four times a day (QID) | ORAL | 1 refills | Status: DC

## 2022-05-03 MED ORDER — METHYLERGONOVINE MALEATE 0.2 MG/ML IJ SOLN: 0.2000 mg | INTRAMUSCULAR | Status: DC | PRN

## 2022-05-03 MED ORDER — BENZOCAINE-MENTHOL 20-0.5 % EX AERO: 1.0000 "application " | INHALATION_SPRAY | CUTANEOUS | Status: DC | PRN

## 2022-05-03 MED ORDER — FERROUS SULFATE 325 (65 FE) MG PO TABS
325.0000 mg | ORAL_TABLET | Freq: Every day | ORAL | Status: DC
  Administered 2022-05-03 – 2022-05-04 (×2): 325 mg via ORAL
  Filled 2022-05-03 (×2): qty 1

## 2022-05-03 MED ORDER — DIBUCAINE (PERIANAL) 1 % EX OINT
1.0000 | TOPICAL_OINTMENT | CUTANEOUS | Status: DC | PRN
Start: 2022-05-03 — End: 2022-05-04

## 2022-05-03 MED ORDER — ONDANSETRON HCL 4 MG PO TABS: 4.0000 mg | ORAL_TABLET | ORAL | Status: DC | PRN

## 2022-05-03 MED ORDER — SENNOSIDES-DOCUSATE SODIUM 8.6-50 MG PO TABS
2.0000 | ORAL_TABLET | Freq: Every day | ORAL | Status: DC
  Administered 2022-05-04: 2 via ORAL
  Filled 2022-05-03: qty 2

## 2022-05-03 MED ORDER — PRENATAL MULTIVITAMIN CH
1.0000 | ORAL_TABLET | Freq: Every day | ORAL | Status: DC
  Administered 2022-05-04: 1 via ORAL
  Filled 2022-05-03 (×2): qty 1

## 2022-05-03 MED ORDER — IBUPROFEN 600 MG PO TABS
600.0000 mg | ORAL_TABLET | Freq: Four times a day (QID) | ORAL | Status: DC
  Administered 2022-05-03 – 2022-05-04 (×4): 600 mg via ORAL
  Filled 2022-05-03 (×5): qty 1

## 2022-05-03 MED ORDER — METHYLERGONOVINE MALEATE 0.2 MG PO TABS: 0.2000 mg | ORAL_TABLET | ORAL | Status: DC | PRN

## 2022-05-03 MED ORDER — COCONUT OIL OIL: 1.0000 "application " | TOPICAL_OIL | Status: DC | PRN

## 2022-05-03 NOTE — Progress Notes (Signed)
Postpartum Note Day #1 ? ?S:  Patient doing well.  Pain controlled.  Tolerating regular diet.   Ambulating and voiding without difficulty.   Denies fevers, chills, chest pain, SOB, N/V, or worsening bilateral LE edema. ? ?Lochia: Minimal to moderate ?Infant feeding:  Breast ?Circumcision:  N/A, female infant ?Contraception:  Will be discussed at PPV ? ?O: Temp:  [97.3 ?F (36.3 ?C)-98 ?F (36.7 ?C)] 98 ?F (36.7 ?C) (05/12 1610) ?Pulse Rate:  [62-103] 62 (05/12 0639) ?Resp:  [16-18] 16 (05/12 9604) ?BP: (102-132)/(44-77) 103/60 (05/12 5409) ?SpO2:  [100 %] 100 % (05/12 0639) ?Weight:  [79.2 kg] 79.2 kg (05/11 1700) ?Gen: NAD, pleasant and cooperative ?Resp: No increased work of breathing ?Abdomen: soft, non-distended, non-tender throughout ?Uterus: firm, non-tender, below umbilicus ?Ext: No bilateral LE edema, no bilateral calf tenderness ? ?Labs:  ?Recent Labs  ?  05/02/22 ?1700 05/03/22 ?8119  ?HGB 12.8 11.7*  ? ? ?A/P: Patient is a 36 y.o. [id number] PPD#1 s/p SVD. ? ?- Pain well controlled  ?- GU: UOP is adequate ?- GI: Tolerating regular diet ?- Activity: encouraged sitting up to chair and ambulation as tolerated ?- DVT Prophylaxis: Ambulation ?- Labs: as above ? ?Disposition:  D/C home PPD#2 ? ?Steva Ready, DO ?[phone] (office) ? ? ? ? ? ? ?

## 2022-05-03 NOTE — Lactation Note (Signed)
This note was copied from a baby's chart. ?Lactation Consultation Note ? ?Patient Name: Girl Ola Holecek ?Today's Date: 05/03/2022 ?Reason for consult: Initial assessment ?Age:35 hours ? ?P4, Ex BF 2 years with last child.  Mother plans to breast and formula feed this baby.  Encouraged offering breast before formula to help establish her milk supply. ?Observed latch, provided education on how infant's breath while breastfeeding. ?Feed on demand with cues.  Goal 8-12+ times per day after first 24 hrs.  Place baby STS if not cueing.  ?Mom made aware of O/P services, breastfeeding support groups, community resources, and our phone # for post-discharge questions.  ?Mother will call for assistance as needed. ? ?Maternal Data ?Has patient been taught Hand Expression?: Yes ?Does the patient have breastfeeding experience prior to this delivery?: Yes ?How long did the patient breastfeed?: 2 years with last child, others formula fed ? ?Feeding ?Mother's Current Feeding Choice: Breast Milk and Formula ?Nipple Type: Slow - flow ? ?LATCH Score ?Latch: Grasps breast easily, tongue down, lips flanged, rhythmical sucking. ? ?Audible Swallowing: A few with stimulation ? ?Type of Nipple: Everted at rest and after stimulation ? ?Comfort (Breast/Nipple): Soft / non-tender ? ?Hold (Positioning): Assistance needed to correctly position infant at breast and maintain latch. ? ?LATCH Score: 8 ? ? ?Lactation Tools Discussed/Used ? Mom Cozi personl DEBP at home. ? ?Interventions ?Interventions: Assisted with latch;Skin to skin;Support pillows;Education;LC Services brochure ? ? ?Consult Status ?Consult Status: PRN ? ? ? ?Dahlia Byes Boschen ?05/03/2022, 10:31 AM ? ? ? ?

## 2022-05-03 NOTE — Lactation Note (Signed)
This note was copied from a baby's chart. ?Lactation Consultation Note ? ?Patient Name: Danielle Carr ?Today's Date: 05/03/2022 ?  ?Age:35 hours ?Per RN in L&D, mom declined LC services in L&D. ?Maternal Data ?  ? ?Feeding ?  ? ?LATCH Score ?  ? ?  ? ?  ? ?  ? ?  ? ?  ? ? ?Lactation Tools Discussed/Used ?  ? ?Interventions ?  ? ?Discharge ?  ? ?Consult Status ?  ? ? ? ?[name] ?05/03/2022, 12:13 AM ? ? ? ?

## 2022-05-04 NOTE — Discharge Summary (Signed)
Physician Discharge Summary  ?Patient ID: ?Danielle Carr ?MRN: [id number] ?DOB/AGE: January 17, 1986 36 y.o. ? ?Admit date: 05/02/2022 ?Discharge date: 05/04/2022 ? ?Admission Diagnoses: ?Term Pregnancy ?Elective IOL ? ?Discharge Diagnoses:  ?Principal Problem: ?  Term pregnancy ? ? ?Discharged Condition: good ? ?Hospital Course: Normal PP course.  Discharge instructions reviewed on PPD 2. ? ?Consults: None ? ?Significant Diagnostic Studies: labs: Hgb 11.7 ? ?Treatments: routine PP care ? ?Discharge Exam: ?Blood pressure 108/63, pulse 73, temperature 97.8 ?F (36.6 ?C), temperature source Oral, resp. rate 18, height 4\' 11"  (1.499 m), weight 79.2 kg, last menstrual period 07/31/2021, SpO2 99 %, unknown if currently breastfeeding. ?General appearance: alert and no distress ?Resp: clear to auscultation bilaterally ?Cardio: regular rate and rhythm ?GI: soft, NT, FF ?Extremities: extremities normal, atraumatic, no cyanosis or edema ? ?Disposition: Discharge disposition: 01-Home or Self Care ? ? ? ? ? ? ?Discharge Instructions   ? ? Activity as tolerated   Complete by: As directed ?  ? Call MD for:  difficulty breathing, headache or visual disturbances   Complete by: As directed ?  ? Call MD for:  persistant nausea and vomiting   Complete by: As directed ?  ? Call MD for:  severe uncontrolled pain   Complete by: As directed ?  ? Call MD for:  temperature >100.4   Complete by: As directed ?  ? Diet general   Complete by: As directed ?  ? No wound care   Complete by: As directed ?  ? Sexual activity   Complete by: As directed ?  ? Avoid sex for 6 weeks  ? ?  ? ?Allergies as of 05/04/2022   ? ?   Reactions  ? Lavender Oil Hives  ? Sulfa Antibiotics Hives, Swelling  ? ?  ? ?  ?Medication List  ?  ? ?TAKE these medications   ? ?BIOTIN PO ?Take 2 each by mouth daily. Gummy Biotin ?  ?ibuprofen 600 MG tablet ?Commonly known as: ADVIL ?Take 1 tablet (600 mg total) by mouth every 6 (six) hours. ?What changed:  ?when to take this ?reasons to  take this ?  ?prenatal multivitamin Tabs tablet ?Take 1 tablet by mouth daily at 12 noon. ?  ? ?  ? ? Follow-up Information   ? ? 05/06/2022, MD Follow up in 6 week(s).   ?Specialty: Obstetrics and Gynecology ?Why: Please keep your previously scheduled 6 week postpartum visit. ?Contact information: ?[address] ?[address] ?[place] ?[phone] ? ? ?  ?  ? ?  ?  ? ?  ? ? ?Signed: ?[phone] ?05/04/2022, 11:43 AM ? ? ?

## 2022-05-13 ENCOUNTER — Telehealth (HOSPITAL_COMMUNITY): Payer: Self-pay | Admitting: *Deleted

## 2022-05-13 NOTE — Telephone Encounter (Signed)
Left phone voicemail message.  Duffy Rhody, RN 05-13-2022 at 9:48am

## 2022-10-01 ENCOUNTER — Encounter (HOSPITAL_BASED_OUTPATIENT_CLINIC_OR_DEPARTMENT_OTHER): Payer: Self-pay | Admitting: Obstetrics and Gynecology

## 2022-10-01 NOTE — Progress Notes (Signed)
10/01/2022 11:17 AM Spoke w/ via phone for pre-op interview---PATIENT Lab needs dos----  URINE PREG. Lab results------IN EPIC COVID test -----patient states asymptomatic no test needed Arrive at -------1030 NPO after MN NO Solid Food.  Clear liquids from MN until---0830 Med rec completed Medications to take morning of surgery -----NONE Diabetic medication ----NA- Patient instructed no nail polish to be worn day of surgery Patient instructed to bring photo id and insurance card day of surgery Patient aware to have Driver (ride ) / caregiver    for 24 hours after surgery -HUSBAND Patient Special Instructions -----NA Pre-Op special Istructions -----NA Patient verbalized understanding of instructions that were given at this phone interview. Patient denies shortness of breath, chest pain, fever, cough at this phone interview.  Dryden Tapley, Arville Lime

## 2022-10-07 ENCOUNTER — Other Ambulatory Visit: Payer: Self-pay | Admitting: Obstetrics and Gynecology

## 2022-10-07 DIAGNOSIS — Z302 Encounter for sterilization: Secondary | ICD-10-CM

## 2022-10-07 NOTE — H&P (Deleted)
  The note originally documented on this encounter has been moved the the encounter in which it belongs.  

## 2022-10-07 NOTE — H&P (Signed)
Reason for Appointment 1. PreOp for 10/09/22 History of Present Illness General:         36 y/o presents for preop visit. Pt is schedule for alaparoscopic bilateral tubal ligation on 10/09/2022 for contraception management.  Current Medications Taking Biotin Discontinued Norethin Ace-Eth Estrad-FE 1.5-30 MG-MCG Tablet 1 tablet Orally Once a day Medication List reviewed and reconciled with the patient Past Medical History      Anemia. Surgical History       Denies Past Surgical History Family History Father: alive Mother: alive Paternal Grand Father: deceased Paternal Grand Mother: deceased, stroke, diagnosed with Diabetes, Hypertension Maternal Grand Father: alive, diagnosed with CVA Maternal Grand Mother: alive, hypercholesterolemia, diagnosed with Hypertension Paternal aunt: alive, diagnosed with Diabetes 1 son(s) , 2 daughter(s) . denies any GYN family cancer hx. Social History General:  Tobacco use  cigarettes:  Never smoked, Tobacco history last updated  10/03/2022, Vaping  No. EXPOSURE TO PASSIVE SMOKE: no. Alcohol: no. Caffeine: yes, coffee, very little. Recreational drug use: no. Exercise: no. Marital Status: married. Children: 1, Boys, 2, girls. OCCUPATION: unemployed, House wife. Gyn History Sexual activity currently sexually active.  Periods : every month.  LMP 09/26/2022.  Denies H/O Birth control.  Last pap smear date 10/04/2021-neg.  Denies H/O Last mammogram date.  Denies H/O Abnormal pap smear.  Denies H/O STD.  Menarche 31.  OB History Number of pregnancies  7.  miscarriages  3.  Pregnancy # 1  2009 - miscarriage.  Pregnancy # 2  07/2009, vaginal delivery, 7lbs.  Pregnancy # 3  miscarriage, 2015.  Pregnancy # 4:  2016, live birth, vaginal delivery.  Pregnancy # 5:  Miscarriage.  Pregnancy # 6  live birth, vaginal delivery, , girl.  Pregnancy # 7  Current.  Allergies Sulfa Drugs: Swelling Hospitalization/Major Diagnostic Procedure childbirth x 3  2010,2016,2020, Childbirth 04/2022 Review of Systems CONSTITUTIONAL:         Chills No.  Fatigue No.  Fever No.  Night sweats No.  Recent travel outside [place] No.  Sweats No.  Weight change No.     OPHTHALMOLOGY:         Blurring of vision no.  Change in vision no.  Double vision no.     ENT:         Dizziness no.  Nose bleeds no.  Sore throat no.  Teeth pain no.     ALLERGY:         Hives no.     CARDIOLOGY:         Chest pain no.  High blood pressure no.  Irregular heart beat no.  Leg edema no.  Palpitations no.     RESPIRATORY:         Shortness of breath no.  Cough no.  Wheezing no.     UROLOGY:         Pain with urination no.  Urinary urgency no.  Urinary frequency no.  Urinary incontinence no.  Difficulty urinating No.  Blood in urine No.     GASTROENTEROLOGY:         Abdominal pain no.  Appetite change no.  Bloating/belching no.  Blood in stool or on toilet paper no.  Change in bowel movements no.  Constipation no.  Diarrhea no.  Difficulty swallowing no.  Nausea no.     FEMALE REPRODUCTIVE:         Vulvar pain no.  Vulvar rash no.  Abnormal vaginal bleeding no.  Breast pain no.  Nipple discharge  no.  Pain with intercourse no.  Pelvic pain no.  Unusual vaginal discharge no.  Vaginal itching no.     MUSCULOSKELETAL:         Muscle aches no.     NEUROLOGY:         Headache no.  Tingling/numbness no.  Weakness no.     PSYCHOLOGY:         Depression no.  Anxiety no.  Nervousness no.  Sleep disturbances no.  Suicidal ideation no .     ENDOCRINOLOGY:         Excessive thirst no.  Excessive urination no.  Hair loss no.  Heat or cold intolerance no.     HEMATOLOGY/LYMPH:         Abnormal bleeding no.  Easy bruising no.  Swollen glands no.     DERMATOLOGY:         New/changing skin lesion no.  Rash no.  Sores no.          Vital Signs Wt: 122.6, Wt change: -4.4 lbs, Ht: 59, BMI: 24.76, Pulse sitting: 105, BP sitting: 109/75. Examination General Examination:        CONSTITUTIONAL:  alert, oriented, NAD. SKIN:  moist, warm. EYES:  Conjunctiva clear. LUNGS: good I:E efffort noted, clear to auscultation bilaterally. HEART: regular rate and rhythm. ABDOMEN: soft, non-tender/non-distended, bowel sounds present. FEMALE GENITOURINARY: normal external genitalia, labia - unremarkable, vagina - pink moist mucosa, no lesions or abnormal discharge, cervix - no discharge or lesions or CMT, adnexa - no masses or tenderness, uterus - nontender and normal size on palpation. EXTREMITIES: no edema present. PSYCH:  affect normal, good eye contact.  Physical Examination Chaperone present:         Chaperone present  Cleophas Dunker, [place] 10/03/2022 11:23:06 AM >, for pelvic exam.      Assessments 1. Contraceptive management - Z30.9 (Primary) Treatment 1. Contraceptive management  Notes: I discussed with Mrs. Reel options of management. She desires permanent sterilization. I discussed with her the difference between the option of laparoscopic bilateral tubal ligation. Versus laparoscopic bilateral salpingectomy. She would prefer to have a laparoscopic bilateral tubal location. Risk benefits alternatives discussed including but not limited to infection bleeding damage to bowel bladder and ureters with the need for further surgery. Risk of failure is 1%. With ectopic pregnancy possible if failure occurs. She advised that ectopic pregnancy can be life-threatening. She voiced understanding and desires to proceed with laparoscopic bilateral tubal ligation.  She is advised to avoid eating or drinking after and midnight the night prior to surgery. She is also advised to avoid nonsteroidal anti-inflammatories between now and her surgery.Marland Kitchen

## 2022-10-09 ENCOUNTER — Other Ambulatory Visit: Payer: Self-pay

## 2022-10-09 ENCOUNTER — Encounter (HOSPITAL_BASED_OUTPATIENT_CLINIC_OR_DEPARTMENT_OTHER)
Admission: RE | Disposition: A | Discharge status: Home / Self Care | Payer: Self-pay | Source: Home / Self Care | Attending: Obstetrics and Gynecology

## 2022-10-09 ENCOUNTER — Encounter (HOSPITAL_BASED_OUTPATIENT_CLINIC_OR_DEPARTMENT_OTHER): Payer: Self-pay | Admitting: Obstetrics and Gynecology

## 2022-10-09 ENCOUNTER — Ambulatory Visit (HOSPITAL_BASED_OUTPATIENT_CLINIC_OR_DEPARTMENT_OTHER): Payer: Commercial Managed Care - HMO | Admitting: Anesthesiology

## 2022-10-09 ENCOUNTER — Ambulatory Visit (HOSPITAL_BASED_OUTPATIENT_CLINIC_OR_DEPARTMENT_OTHER)
Admission: RE | Admit: 2022-10-09 | Discharge: 2022-10-09 | Disposition: A | Discharge status: Home / Self Care | Payer: Commercial Managed Care - HMO | Attending: Obstetrics and Gynecology | Admitting: Obstetrics and Gynecology

## 2022-10-09 DIAGNOSIS — Z9851 Tubal ligation status: Secondary | ICD-10-CM

## 2022-10-09 DIAGNOSIS — Z302 Encounter for sterilization: Secondary | ICD-10-CM | POA: Diagnosis not present

## 2022-10-09 HISTORY — PX: LAPAROSCOPIC TUBAL LIGATION: SHX1937

## 2022-10-09 HISTORY — DX: Anemia, unspecified: D64.9

## 2022-10-09 LAB — CBC
HCT: 43.8 % (ref 36.0–46.0)
Hemoglobin: 14.3 g/dL (ref 12.0–15.0)
MCH: 27.2 pg (ref 26.0–34.0)
MCHC: 32.6 g/dL (ref 30.0–36.0)
MCV: 83.3 fL (ref 80.0–100.0)
Platelets: 311 10*3/uL (ref 150–400)
RBC: 5.26 MIL/uL — ABNORMAL HIGH (ref 3.87–5.11)
RDW: 11.7 % (ref 11.5–15.5)
WBC: 6 10*3/uL (ref 4.0–10.5)
nRBC: 0 % (ref 0.0–0.2)

## 2022-10-09 LAB — POCT PREGNANCY, URINE: Preg Test, Ur: NEGATIVE

## 2022-10-09 LAB — GLUCOSE, CAPILLARY: Glucose-Capillary: 97 mg/dL (ref 70–99)

## 2022-10-09 SURGERY — LIGATION, FALLOPIAN TUBE, LAPAROSCOPIC
Anesthesia: General | Site: Abdomen | Laterality: Bilateral

## 2022-10-09 MED ORDER — EPHEDRINE 5 MG/ML INJ
INTRAVENOUS | Status: AC
  Filled 2022-10-09: qty 5

## 2022-10-09 MED ORDER — MIDAZOLAM HCL 5 MG/5ML IJ SOLN
INTRAMUSCULAR | Status: DC | PRN
  Administered 2022-10-09: 2 mg via INTRAVENOUS

## 2022-10-09 MED ORDER — CEFAZOLIN SODIUM-DEXTROSE 2-4 GM/100ML-% IV SOLN
INTRAVENOUS | Status: AC
  Filled 2022-10-09: qty 100

## 2022-10-09 MED ORDER — LACTATED RINGERS IV SOLN: INTRAVENOUS | Status: DC

## 2022-10-09 MED ORDER — ROCURONIUM BROMIDE 10 MG/ML (PF) SYRINGE
PREFILLED_SYRINGE | INTRAVENOUS | Status: AC
  Filled 2022-10-09: qty 10

## 2022-10-09 MED ORDER — CEFAZOLIN SODIUM-DEXTROSE 2-4 GM/100ML-% IV SOLN
2.0000 g | INTRAVENOUS | Status: AC
  Administered 2022-10-09: 2 g via INTRAVENOUS

## 2022-10-09 MED ORDER — BUPIVACAINE HCL (PF) 0.25 % IJ SOLN
INTRAMUSCULAR | Status: DC | PRN
  Administered 2022-10-09: 10 mL

## 2022-10-09 MED ORDER — GABAPENTIN 300 MG PO CAPS
ORAL_CAPSULE | ORAL | Status: AC
  Filled 2022-10-09: qty 1

## 2022-10-09 MED ORDER — EPHEDRINE SULFATE (PRESSORS) 50 MG/ML IJ SOLN
INTRAMUSCULAR | Status: DC | PRN
  Administered 2022-10-09: 10 mg via INTRAVENOUS

## 2022-10-09 MED ORDER — ROCURONIUM BROMIDE 100 MG/10ML IV SOLN
INTRAVENOUS | Status: DC | PRN
  Administered 2022-10-09: 40 mg via INTRAVENOUS

## 2022-10-09 MED ORDER — POVIDONE-IODINE 10 % EX SWAB: 2.0000 | Freq: Once | CUTANEOUS | Status: DC

## 2022-10-09 MED ORDER — ONDANSETRON HCL 4 MG/2ML IJ SOLN
INTRAMUSCULAR | Status: DC | PRN
  Administered 2022-10-09: 4 mg via INTRAVENOUS

## 2022-10-09 MED ORDER — DEXAMETHASONE SODIUM PHOSPHATE 10 MG/ML IJ SOLN
INTRAMUSCULAR | Status: AC
  Filled 2022-10-09: qty 1

## 2022-10-09 MED ORDER — FENTANYL CITRATE (PF) 100 MCG/2ML IJ SOLN: 25.0000 ug | INTRAMUSCULAR | Status: DC | PRN

## 2022-10-09 MED ORDER — PHENYLEPHRINE 80 MCG/ML (10ML) SYRINGE FOR IV PUSH (FOR BLOOD PRESSURE SUPPORT)
PREFILLED_SYRINGE | INTRAVENOUS | Status: AC
  Filled 2022-10-09: qty 10

## 2022-10-09 MED ORDER — ACETAMINOPHEN 500 MG PO TABS: 1000.0000 mg | ORAL_TABLET | Freq: Three times a day (TID) | ORAL | 1 refills | Status: AC | PRN

## 2022-10-09 MED ORDER — BUPIVACAINE HCL (PF) 0.25 % IJ SOLN
INTRAMUSCULAR | Status: AC
  Filled 2022-10-09: qty 30

## 2022-10-09 MED ORDER — ONDANSETRON HCL 4 MG/2ML IJ SOLN
INTRAMUSCULAR | Status: AC
  Filled 2022-10-09: qty 2

## 2022-10-09 MED ORDER — DEXAMETHASONE SODIUM PHOSPHATE 4 MG/ML IJ SOLN
INTRAMUSCULAR | Status: DC | PRN
  Administered 2022-10-09: 5 mg via INTRAVENOUS

## 2022-10-09 MED ORDER — GABAPENTIN 300 MG PO CAPS
300.0000 mg | ORAL_CAPSULE | ORAL | Status: AC
  Administered 2022-10-09: 300 mg via ORAL

## 2022-10-09 MED ORDER — FENTANYL CITRATE (PF) 100 MCG/2ML IJ SOLN
INTRAMUSCULAR | Status: AC
  Filled 2022-10-09: qty 2

## 2022-10-09 MED ORDER — LIDOCAINE HCL (PF) 2 % IJ SOLN
INTRAMUSCULAR | Status: AC
  Filled 2022-10-09: qty 5

## 2022-10-09 MED ORDER — FENTANYL CITRATE (PF) 100 MCG/2ML IJ SOLN
INTRAMUSCULAR | Status: DC | PRN
  Administered 2022-10-09 (×2): 50 ug via INTRAVENOUS

## 2022-10-09 MED ORDER — LIDOCAINE HCL (CARDIAC) PF 100 MG/5ML IV SOSY
PREFILLED_SYRINGE | INTRAVENOUS | Status: DC | PRN
  Administered 2022-10-09: 80 mg via INTRAVENOUS

## 2022-10-09 MED ORDER — SILVER NITRATE-POT NITRATE 75-25 % EX MISC
CUTANEOUS | Status: DC | PRN
  Administered 2022-10-09: 2

## 2022-10-09 MED ORDER — ACETAMINOPHEN 500 MG PO TABS
ORAL_TABLET | ORAL | Status: AC
  Filled 2022-10-09: qty 2

## 2022-10-09 MED ORDER — 0.9 % SODIUM CHLORIDE (POUR BTL) OPTIME
TOPICAL | Status: DC | PRN
  Administered 2022-10-09: 1000 mL

## 2022-10-09 MED ORDER — KETOROLAC TROMETHAMINE 30 MG/ML IJ SOLN
INTRAMUSCULAR | Status: DC | PRN
  Administered 2022-10-09: 15 mg via INTRAVENOUS

## 2022-10-09 MED ORDER — SUGAMMADEX SODIUM 200 MG/2ML IV SOLN
INTRAVENOUS | Status: DC | PRN
  Administered 2022-10-09: 200 mg via INTRAVENOUS

## 2022-10-09 MED ORDER — ACETAMINOPHEN 500 MG PO TABS
1000.0000 mg | ORAL_TABLET | ORAL | Status: AC
  Administered 2022-10-09: 1000 mg via ORAL

## 2022-10-09 MED ORDER — PROMETHAZINE HCL 25 MG/ML IJ SOLN: 6.2500 mg | INTRAMUSCULAR | Status: DC | PRN

## 2022-10-09 MED ORDER — OXYCODONE HCL 5 MG PO TABS: 5.0000 mg | ORAL_TABLET | Freq: Four times a day (QID) | ORAL | 0 refills | Status: AC | PRN

## 2022-10-09 MED ORDER — PROPOFOL 10 MG/ML IV BOLUS
INTRAVENOUS | Status: AC
  Filled 2022-10-09: qty 20

## 2022-10-09 MED ORDER — IBUPROFEN 800 MG PO TABS: 800.0000 mg | ORAL_TABLET | Freq: Three times a day (TID) | ORAL | 0 refills | Status: AC | PRN

## 2022-10-09 MED ORDER — PHENYLEPHRINE HCL (PRESSORS) 10 MG/ML IV SOLN
INTRAVENOUS | Status: DC | PRN
  Administered 2022-10-09 (×3): 80 ug via INTRAVENOUS

## 2022-10-09 MED ORDER — MIDAZOLAM HCL 2 MG/2ML IJ SOLN
INTRAMUSCULAR | Status: AC
  Filled 2022-10-09: qty 2

## 2022-10-09 MED ORDER — PROPOFOL 10 MG/ML IV BOLUS
INTRAVENOUS | Status: DC | PRN
  Administered 2022-10-09: 120 mg via INTRAVENOUS

## 2022-10-09 SURGICAL SUPPLY — 25 items
ADH SKN CLS APL DERMABOND .7 (GAUZE/BANDAGES/DRESSINGS) ×1
CATH ROBINSON RED A/P 16FR (CATHETERS) ×1 IMPLANT
DERMABOND ADVANCED .7 DNX12 (GAUZE/BANDAGES/DRESSINGS) ×1 IMPLANT
DILATOR CANAL MILEX (MISCELLANEOUS) IMPLANT
DRSG OPSITE POSTOP 3X4 (GAUZE/BANDAGES/DRESSINGS) IMPLANT
DURAPREP 26ML APPLICATOR (WOUND CARE) ×1 IMPLANT
GAUZE 4X4 16PLY ~~LOC~~+RFID DBL (SPONGE) ×2 IMPLANT
GLOVE BIOGEL M 6.5 STRL (GLOVE) ×1 IMPLANT
GLOVE BIOGEL M 7.0 STRL (GLOVE) ×1 IMPLANT
GLOVE BIOGEL PI IND STRL 6.5 (GLOVE) ×4 IMPLANT
GLOVE BIOGEL PI IND STRL 7.0 (GLOVE) ×4 IMPLANT
GOWN STRL REUS W/TWL LRG LVL3 (GOWN DISPOSABLE) ×2 IMPLANT
KIT TURNOVER CYSTO (KITS) ×1 IMPLANT
PACK LAPAROSCOPY BASIN (CUSTOM PROCEDURE TRAY) ×2 IMPLANT
PACK TRENDGUARD 450 HYBRID PRO (MISCELLANEOUS) IMPLANT
POUCH LAPAROSCOPIC INSTRUMENT (MISCELLANEOUS) ×2 IMPLANT
PROTECTOR NERVE ULNAR (MISCELLANEOUS) ×4 IMPLANT
SET TUBE SMOKE EVAC HIGH FLOW (TUBING) ×1 IMPLANT
SOLUTION ELECTROLUBE (MISCELLANEOUS) ×2 IMPLANT
SUT VIC AB 4-0 PS2 18 (SUTURE) ×1 IMPLANT
SUT VICRYL 0 UR6 27IN ABS (SUTURE) ×1 IMPLANT
TOWEL OR 17X26 10 PK STRL BLUE (TOWEL DISPOSABLE) ×4 IMPLANT
TRENDGUARD 450 HYBRID PRO PACK (MISCELLANEOUS)
TROCAR Z-THREAD FIOS 11X100 BL (TROCAR) ×1 IMPLANT
WARMER LAPAROSCOPE (MISCELLANEOUS) ×1 IMPLANT

## 2022-10-09 NOTE — Anesthesia Procedure Notes (Signed)
Procedure Name: Intubation Date/Time: 10/09/2022 1:09 PM  Performed by: Santa Lighter, MDPre-anesthesia Checklist: Patient identified, Emergency Drugs available, Suction available and Patient being monitored Patient Re-evaluated:Patient Re-evaluated prior to induction Oxygen Delivery Method: Circle system utilized Preoxygenation: Pre-oxygenation with 100% oxygen Induction Type: IV induction Ventilation: Mask ventilation without difficulty Laryngoscope Size: Mac and 3 Grade View: Grade III Tube type: Oral Tube size: 7.0 mm Number of attempts: 1 Airway Equipment and Method: Stylet and Oral airway Placement Confirmation: ETT inserted through vocal cords under direct vision, positive ETCO2 and breath sounds checked- equal and bilateral Secured at: 22 cm Tube secured with: Tape Dental Injury: Teeth and Oropharynx as per pre-operative assessment  Difficulty Due To: Difficult Airway- due to anterior larynx Future Recommendations: Recommend- induction with short-acting agent, and alternative techniques readily available Comments: Attempt x1 by CRNA with Grade 3 view. Attempt x1 by MDA with Grade 3 view, ETT passed under direct visualization, +EtCO2, +BBS. Collins [place], MD

## 2022-10-09 NOTE — Anesthesia Preprocedure Evaluation (Signed)
Anesthesia Evaluation  Patient identified by MRN, date of birth, ID band Patient awake    Reviewed: Allergy & Precautions, NPO status , Patient's Chart, lab work & pertinent test results  Airway Mallampati: III  TM Distance: >3 FB Neck ROM: Full    Dental  (+) Teeth Intact, Dental Advisory Given   Pulmonary neg pulmonary ROS,    Pulmonary exam normal breath sounds clear to auscultation       Cardiovascular negative cardio ROS Normal cardiovascular exam Rhythm:Regular Rate:Normal     Neuro/Psych negative neurological ROS     GI/Hepatic negative GI ROS, Neg liver ROS,   Endo/Other  negative endocrine ROS  Renal/GU negative Renal ROS     Musculoskeletal negative musculoskeletal ROS (+)   Abdominal   Peds  Hematology negative hematology ROS (+)   Anesthesia Other Findings Day of surgery medications reviewed with the patient.  Reproductive/Obstetrics                             Anesthesia Physical Anesthesia Plan  ASA: 1  Anesthesia Plan: General   Post-op Pain Management: Tylenol PO (pre-op)* and Gabapentin PO (pre-op)*   Induction: Intravenous  PONV Risk Score and Plan: 4 or greater and Midazolam, Dexamethasone, Ondansetron and Diphenhydramine  Airway Management Planned: Oral ETT  Additional Equipment:   Intra-op Plan:   Post-operative Plan: Extubation in OR  Informed Consent: I have reviewed the patients History and Physical, chart, labs and discussed the procedure including the risks, benefits and alternatives for the proposed anesthesia with the patient or authorized representative who has indicated his/her understanding and acceptance.     Dental advisory given  Plan Discussed with: CRNA  Anesthesia Plan Comments:         Anesthesia Quick Evaluation

## 2022-10-09 NOTE — Discharge Instructions (Addendum)
  Post Anesthesia Home Care Instructions  Activity: Get plenty of rest for the remainder of the day. A responsible individual must stay with you for 24 hours following the procedure.  For the next 24 hours, DO NOT: -Drive a car -Paediatric nurse -Drink alcoholic beverages -Take any medication unless instructed by your physician -Make any legal decisions or sign important papers.  Meals: Start with liquid foods such as gelatin or soup. Progress to regular foods as tolerated. Avoid greasy, spicy, heavy foods. If nausea and/or vomiting occur, drink only clear liquids until the nausea and/or vomiting subsides. Call your physician if vomiting continues.  Special Instructions/Symptoms: Your throat may feel dry or sore from the anesthesia or the breathing tube placed in your throat during surgery. If this causes discomfort, gargle with warm salt water. The discomfort should disappear within 24 hours.  If you had a scopolamine patch placed behind your ear for the management of post- operative nausea and/or vomiting:  1. The medication in the patch is effective for 72 hours, after which it should be removed.  Wrap patch in a tissue and discard in the trash. Wash hands thoroughly with soap and water. 2. You may remove the patch earlier than 72 hours if you experience unpleasant side effects which may include dry mouth, dizziness or visual disturbances. 3. Avoid touching the patch. Wash your hands with soap and water after contact with the patch.    No acetaminophen/Tylenol until after 5 pm today if needed. No ibuprofen, Advil, Aleve, Motrin, ketorolac, meloxicam, naproxen, or other NSAIDS until after 7:45 pm today if needed.

## 2022-10-09 NOTE — Progress Notes (Signed)
1245 patient called out. Tech responded, nurse called. Patient states "I fell tingly" pt's color pale. Chair placed in recliner mode. B/P 87/43 Hr 56 O2 sat 100%. Dr Gifford Shave at bedside.  B/P92/57  after head of chair lowered. 1250 : 80 mg phenylephrine given and  5 mg of epedrine iIV given by Dr. Gifford Shave.  B/P 132/70 69 HR after drugs given. Pt states " I feel better now" To Or via stretcher

## 2022-10-09 NOTE — Op Note (Signed)
10/09/2022  2:10 PM  PATIENT:  Danielle Carr  36 y.o. female  PRE-OPERATIVE DIAGNOSIS:  Z30.09 Sterilization  POST-OPERATIVE DIAGNOSIS:  Z30.09 Sterilization  PROCEDURE:  Procedure(s): LAPAROSCOPIC TUBAL LIGATION (Bilateral)  SURGEON:  Surgeon(s) and Role:    Christophe Louis, MD - Primary  PHYSICIAN ASSISTANT:   ASSISTANTS: none   ANESTHESIA:   general  EBL:  10 mL   BLOOD ADMINISTERED:none  DRAINS: none   LOCAL MEDICATIONS USED:  MARCAINE     SPECIMEN:  No Specimen  DISPOSITION OF SPECIMEN:  N/A  COUNTS:  YES  TOURNIQUET:  * No tourniquets in log *  DICTATION: .Note written in EPIC  PLAN OF CARE: Discharge to home after PACU  PATIENT DISPOSITION:  PACU - hemodynamically stable.   Delay start of Pharmacological VTE agent (>24hrs) due to surgical blood loss or risk of bleeding: not applicable  Findings Normal appearing uterus fallopian tubes and ovaries.   Procedure: The patient was taken to the operating room #4 at [facility]  where she was placed under general anesthesia. She is placed in the dorsolithotomy position. She was prepped and draped in the usual sterile fashion. Speculum was placed into the vaginal vault. In the anterior lip of the cervix grasped with a single-tooth tenaculum. A uterine manipulator was placed without difficulty. The single-tooth tenaculum was  Removed. The speculum was removed.  Attention was turned to the umbilicus which was injected with 10 cc of quarter percent Marcaine. A 11 mm incision was made with the scalpel. A 11 mm trocar was placed under direct visualization. Entry into the peritoneum was visualized. The pneumoperitoneum was achieved with CO2 gas.  Her  fallopian tubes and ovaries appeared normal.  Operative scope was inserted  and the right fallopian tube was identified and followed out to the fimbriated end . The tube was cauterized with Kleppinger Along the ampullary  portion of the right fallopian  tube without difficulty.    This was repeated on the left fallopian tube. The cauterized segment was cut with laparoscopic scissors.  A laparoscopic needle was inserted and 10 cc of quarter percent Marcaine was placed along the ampullary portion of the tubes  bilaterally. Pneumoperitoneum was released. The umbilical trocar was removed under direct visualization. The fascia was closed with 0 Vicryl. The skin was closed with 4-0 Vicryl. The dermabond placed a over the skin incision.  Patient was awakened from anesthesia taken to the recovery room awake and in stable condition.

## 2022-10-09 NOTE — H&P (Signed)
Date of Initial H&P: 10/07/2022  History reviewed, patient examined, no change in status, stable for surgery.  

## 2022-10-09 NOTE — Transfer of Care (Signed)
Immediate Anesthesia Transfer of Care Note  Patient: Danielle Carr  Procedure(s) Performed: Procedure(s) (LRB): LAPAROSCOPIC TUBAL LIGATION (Bilateral)  Patient Location: PACU  Anesthesia Type: General  Level of Consciousness: awake, sedated, patient cooperative and responds to stimulation  Airway & Oxygen Therapy: Patient Spontanous Breathing and Patient connected to [place] oxygen  Post-op Assessment: Report given to PACU RN, Post -op Vital signs reviewed and stable and Patient moving all extremities  Post vital signs: Reviewed and stable  Complications: No apparent anesthesia complications

## 2022-10-10 ENCOUNTER — Encounter (HOSPITAL_BASED_OUTPATIENT_CLINIC_OR_DEPARTMENT_OTHER): Payer: Self-pay | Admitting: Obstetrics and Gynecology

## 2022-10-10 NOTE — Anesthesia Postprocedure Evaluation (Signed)
Anesthesia Post Note  Patient: Danielle Carr  Procedure(s) Performed: LAPAROSCOPIC TUBAL LIGATION (Bilateral: Abdomen)     Patient location during evaluation: PACU Anesthesia Type: General Level of consciousness: awake and alert Pain management: pain level controlled Vital Signs Assessment: post-procedure vital signs reviewed and stable Respiratory status: spontaneous breathing, nonlabored ventilation, respiratory function stable and patient connected to nasal cannula oxygen Cardiovascular status: blood pressure returned to baseline and stable Postop Assessment: no apparent nausea or vomiting Anesthetic complications: no   No notable events documented.  Last Vitals:  Vitals:   10/09/22 1530 10/09/22 1552  BP: 120/80 114/89  Pulse:  77  Resp:  17  Temp:    SpO2:  98%    Last Pain:  Vitals:   10/10/22 0937  TempSrc:   PainSc: 0-No pain                 Santa Lighter

## 2024-03-11 ENCOUNTER — Other Ambulatory Visit (HOSPITAL_COMMUNITY)
Admission: RE | Admit: 2024-03-11 | Discharge: 2024-03-11 | Disposition: A | Discharge status: Home / Self Care | Source: Ambulatory Visit | Attending: Obstetrics and Gynecology | Admitting: Obstetrics and Gynecology

## 2024-03-11 ENCOUNTER — Other Ambulatory Visit: Payer: Self-pay | Admitting: Obstetrics and Gynecology

## 2024-03-11 DIAGNOSIS — Z01419 Encounter for gynecological examination (general) (routine) without abnormal findings: Secondary | ICD-10-CM | POA: Diagnosis present

## 2024-03-15 LAB — CYTOLOGY - PAP
Comment: NEGATIVE
Diagnosis: NEGATIVE
High risk HPV: NEGATIVE

## 2024-04-15 ENCOUNTER — Other Ambulatory Visit: Payer: Self-pay | Admitting: Obstetrics and Gynecology

## 2024-04-15 DIAGNOSIS — Z1231 Encounter for screening mammogram for malignant neoplasm of breast: Secondary | ICD-10-CM

## 2024-04-22 ENCOUNTER — Ambulatory Visit
Admission: RE | Admit: 2024-04-22 | Discharge: 2024-04-22 | Disposition: A | Discharge status: Home / Self Care | Source: Ambulatory Visit | Attending: Obstetrics and Gynecology | Admitting: Obstetrics and Gynecology

## 2024-04-22 DIAGNOSIS — Z1231 Encounter for screening mammogram for malignant neoplasm of breast: Secondary | ICD-10-CM
